# Patient Record
Sex: Male | Born: 1975 | Race: White | Hispanic: No | Marital: Single | Smoking: Former smoker
Health system: Southern US, Community
[De-identification: ages and names within clinical notes are randomized; demographics above are authoritative.]

## PROBLEM LIST (undated history)

## (undated) DIAGNOSIS — H9192 Unspecified hearing loss, left ear: Secondary | ICD-10-CM

## (undated) DIAGNOSIS — Z91199 Patient's noncompliance with other medical treatment and regimen due to unspecified reason: Secondary | ICD-10-CM

## (undated) DIAGNOSIS — M2342 Loose body in knee, left knee: Secondary | ICD-10-CM

## (undated) DIAGNOSIS — E785 Hyperlipidemia, unspecified: Secondary | ICD-10-CM

## (undated) DIAGNOSIS — M1990 Unspecified osteoarthritis, unspecified site: Secondary | ICD-10-CM

## (undated) DIAGNOSIS — F419 Anxiety disorder, unspecified: Secondary | ICD-10-CM

## (undated) DIAGNOSIS — I10 Essential (primary) hypertension: Secondary | ICD-10-CM

## (undated) HISTORY — DX: Hyperlipidemia, unspecified: E78.5

## (undated) HISTORY — PX: ANTERIOR CRUCIATE LIGAMENT REPAIR: SHX115

## (undated) HISTORY — PX: KNEE ARTHROSCOPY: SHX127

## (undated) HISTORY — DX: Essential (primary) hypertension: I10

---

## 2014-11-18 LAB — BASIC METABOLIC PANEL
BUN: 13 mg/dL (ref 4–21)
Potassium: 5.3 mmol/L (ref 3.4–5.3)
SODIUM: 140 mmol/L (ref 137–147)

## 2014-11-18 LAB — HEPATIC FUNCTION PANEL
ALK PHOS: 34 U/L (ref 25–125)
ALT: 39 U/L (ref 10–40)
AST: 22 U/L (ref 14–40)
BILIRUBIN, TOTAL: 0.8 mg/dL

## 2014-11-18 LAB — LIPID PANEL
Cholesterol: 147 mg/dL (ref 0–200)
HDL: 39 mg/dL (ref 35–70)
LDL CALC: 81 mg/dL
TRIGLYCERIDES: 137 mg/dL (ref 40–160)

## 2016-11-08 ENCOUNTER — Ambulatory Visit (INDEPENDENT_AMBULATORY_CARE_PROVIDER_SITE_OTHER): Payer: BLUE CROSS/BLUE SHIELD | Admitting: Family Medicine

## 2016-11-08 ENCOUNTER — Encounter (HOSPITAL_BASED_OUTPATIENT_CLINIC_OR_DEPARTMENT_OTHER): Payer: Self-pay | Admitting: *Deleted

## 2016-11-08 ENCOUNTER — Emergency Department (HOSPITAL_BASED_OUTPATIENT_CLINIC_OR_DEPARTMENT_OTHER): Payer: BLUE CROSS/BLUE SHIELD

## 2016-11-08 ENCOUNTER — Encounter: Payer: Self-pay | Admitting: Family Medicine

## 2016-11-08 VITALS — BP 132/84 | HR 77 | Ht 73.25 in | Wt 238.2 lb

## 2016-11-08 DIAGNOSIS — J3089 Other allergic rhinitis: Secondary | ICD-10-CM | POA: Insufficient documentation

## 2016-11-08 DIAGNOSIS — Z9119 Patient's noncompliance with other medical treatment and regimen: Secondary | ICD-10-CM

## 2016-11-08 DIAGNOSIS — I10 Essential (primary) hypertension: Secondary | ICD-10-CM | POA: Insufficient documentation

## 2016-11-08 DIAGNOSIS — Z91199 Patient's noncompliance with other medical treatment and regimen due to unspecified reason: Secondary | ICD-10-CM | POA: Insufficient documentation

## 2016-11-08 DIAGNOSIS — E785 Hyperlipidemia, unspecified: Secondary | ICD-10-CM

## 2016-11-08 DIAGNOSIS — R0683 Snoring: Secondary | ICD-10-CM | POA: Diagnosis not present

## 2016-11-08 DIAGNOSIS — Z9889 Other specified postprocedural states: Secondary | ICD-10-CM | POA: Insufficient documentation

## 2016-11-08 DIAGNOSIS — Z716 Tobacco abuse counseling: Secondary | ICD-10-CM

## 2016-11-08 DIAGNOSIS — F17200 Nicotine dependence, unspecified, uncomplicated: Secondary | ICD-10-CM | POA: Diagnosis not present

## 2016-11-08 DIAGNOSIS — R0689 Other abnormalities of breathing: Secondary | ICD-10-CM

## 2016-11-08 DIAGNOSIS — R002 Palpitations: Secondary | ICD-10-CM

## 2016-11-08 DIAGNOSIS — E66811 Obesity, class 1: Secondary | ICD-10-CM | POA: Insufficient documentation

## 2016-11-08 DIAGNOSIS — Z8249 Family history of ischemic heart disease and other diseases of the circulatory system: Secondary | ICD-10-CM

## 2016-11-08 DIAGNOSIS — F430 Acute stress reaction: Secondary | ICD-10-CM

## 2016-11-08 DIAGNOSIS — E669 Obesity, unspecified: Secondary | ICD-10-CM

## 2016-11-08 DIAGNOSIS — R0789 Other chest pain: Secondary | ICD-10-CM | POA: Diagnosis present

## 2016-11-08 NOTE — Assessment & Plan Note (Addendum)
FLP and LFT's Tomorrow  Encouraged smoking cess and better bp control as well.   Dietary changes d/c pt and exercise 75min 5 d/wk.  F/up to discuss

## 2016-11-08 NOTE — Assessment & Plan Note (Signed)
Lost to f/up and went off his Chol med and BP med in past year or so.   - Risks associated with noncompliant behavior with my treatment regimen or treatment plan reviewed with patient.   Pt denies depression/ mood disorder, denies significant barriers of education, money etc.  Explained many significant risks associated with these disease processes that if they remain poorly controlled, can even lead to death.

## 2016-11-08 NOTE — Assessment & Plan Note (Signed)
Long d/c pt re: possible sx of GAD vs Cardiac Sx, he was not open at all to the idea that these sx could be stress mediated - we discussed red flag sx- which he has none of- if dev any, call office or to UC if we r not open - Pt tells me during his PE today that he feels his heart racing and palpitations- which exam was N with prolonged auscultation.  - pt's sx have not changed since last seen by his cards doc in [place] and had negative w/up including stress echo, holter monitoring etc.   I told pt he should feel reassurred by this, but he does not. - he declines cards referral now and I rec he let me see his recent past med records first before deciding next steps.

## 2016-11-08 NOTE — Assessment & Plan Note (Addendum)
Wife says he has long periods breath holding followed by gasping for air,  Awakens not feeling refreshed sev mornings - Never had OSA dx or sleep study- says he is finally ready for one esp after explaining possible detriments to health with OSA that is poorly controlled.

## 2016-11-08 NOTE — Patient Instructions (Addendum)
The work-up or your non-specifc heart palpitation symptoms will require a systematic approach.     1- we will obtain fasting blood work on you in the very near future. This will include a CBC, CMP, TSH, A1c, fasting lipid profile, vitamin D and TSH, free t4   2- please give me your med records from your Cards doc in [place].    It is important I see results of the echocardiogram and other cardiac workup that was done by cardiologist prior to determining what next steps need to be done, since these are same symptoms you have had in the past but just more frequent  3- Referral for sleep study placed today  4-  It's important that you make a f/up OV in 2 wks approx to review your labs and review your home BP's.  Bring in your log.  2 wks is good b/c we will have enough data points for your BP.    Marland Kitchenstre

## 2016-11-08 NOTE — Assessment & Plan Note (Signed)
Obtain labs  - will prescribe chantix in future if labs N and pt still desires to quit.  - go to [facility] website and read about smoking cess please.

## 2016-11-08 NOTE — Assessment & Plan Note (Signed)
Lifestyle changes such as dash diet and engaging in a regular exercise program, and losing wt discussed with patient.    Ambulatory BP monitoring encouraged. Keep log and bring in next OV!!  Goal BP <130/80 on regular basis.  If not at goal at home and next ov-- pt understands will need meds

## 2016-11-08 NOTE — ED Triage Notes (Signed)
Pt c/o palpitations  X 2 weeks and left calf pain x 1 day

## 2016-11-08 NOTE — Progress Notes (Signed)
New patient office visit note:  Impression and Recommendations:    1. Obesity, Class I, BMI 30-34.9   2. Hyperlipidemia, unspecified hyperlipidemia type   3. Essential hypertension   4. Snores   5. Breath holding episodes   6. Acute reaction to stress   7. medical Noncompliance   8. Tobacco use disorder   9. Tobacco abuse counseling   10. Palpitations   11. Family history of early CAD     Orders Placed This Encounter  Procedures  . CBC with Differential/Platelet  . Comprehensive metabolic panel  . Hemoglobin A1c  . Lipid panel  . T4, free  . TSH  . VITAMIN D 25 Hydroxy (Vit-D Deficiency, Fractures)  . Ambulatory referral to Sleep Studies     Snores  Wife says he has long periods breath holding followed by gasping for air,  Awakens not feeling refreshed sev mornings - Never had OSA dx or sleep study- says he is finally ready for one esp after explaining possible detriments to health with OSA that is poorly controlled.   Acute reaction to stress Long d/c pt re: possible sx of GAD vs Cardiac Sx, he was not open at all to the idea that these sx could be stress mediated - we discussed red flag sx- which he has none of- if dev any, call office or to UC if we r not open - Pt tells me during his PE today that he feels his heart racing and palpitations- which exam was N with prolonged auscultation.  - pt's sx have not changed since last seen by his cards doc in [place] and had negative w/up including stress echo, holter monitoring etc.   I told pt he should feel reassurred by this, but he does not. - he declines cards referral now and I rec he let me see his recent past med records first before deciding next steps.   medical Noncompliance Lost to f/up and went off his Chol med and BP med in past year or so.   - Risks associated with noncompliant behavior with my treatment regimen or treatment plan reviewed with patient.   Pt denies depression/ mood disorder, denies significant  barriers of education, money etc.  Explained many significant risks associated with these disease processes that if they remain poorly controlled, can even lead to death.  Hypertension Lifestyle changes such as dash diet and engaging in a regular exercise program, and losing wt discussed with patient.    Ambulatory BP monitoring encouraged. Keep log and bring in next OV!!  Goal BP <130/80 on regular basis.  If not at goal at home and next ov-- pt understands will need meds  Tobacco use disorder Obtain labs  - will prescribe chantix in future if labs N and pt still desires to quit.  - go to [facility] website and read about smoking cess please.   Hyperlipidemia FLP and LFT's Tomorrow  Encouraged smoking cess and better bp control as well.   Dietary changes d/c pt and exercise 23min 5 d/wk.  F/up to discuss   Palpitations Obtain cmp, TSH, T4, cbc, etc  Explained that could be electrolyte ABN or thyroid issue or stress etc.  - await labs and med records.  - f/up 1-2 wks.   Pt was in the office today for 50+ minutes, with over 50% time spent in face to face counseling of various medical concerns and in coordination of care  Patient's Medications  New Prescriptions   No medications  on file  Previous Medications   ATORVASTATIN (LIPITOR) 40 MG TABLET    Take 40 mg by mouth daily.   LOSARTAN-HYDROCHLOROTHIAZIDE (HYZAAR) 50-12.5 MG TABLET    Take 1 tablet by mouth daily.  Modified Medications   No medications on file  Discontinued Medications   No medications on file    The patient was counseled, risk factors were discussed, anticipatory guidance given.  Gross side effects, risk and benefits, and alternatives of medications discussed with patient.  Patient is aware that all medications have potential side effects and we are unable to predict every side effect or drug-drug interaction that may occur.  Expresses verbal understanding and consents to current therapy plan and  treatment regimen.   Return in about 1 week (around 11/15/2016) for Fasting blood work and then ov with [place] 2 wks- reck bp, smok cess, chol.   Please see AVS handed out to patient at the end of our visit for further patient instructions/ counseling done pertaining to today's office visit.    Note: This document was prepared using Dragon voice recognition software and may include unintentional dictation errors.  ----------------------------------------------------------------------------------------------------------------------    Subjective:    Chief complaint:   Chief Complaint  Patient presents with  . Establish Care  . Palpitations     HPI: Anthony Garcia is a pleasant 41 y.o. male who presents to [place] [facility] today to review their medical history with me and establish care.   I asked the patient to review their chronic problem list with me to ensure everything was updated and accurate.    All recent office visits with other providers, any medical records that patient brought in etc  - I reviewed today.     Also asked pt to get me medical records from [facility] providers/ specialists that they had seen within the past 3-5 years- if they are in private practice and/or do not work for a [employer], [facility], [place], [place] or [employer] owned practice.  Told them to call their specialists to clarify this if they are not sure.    - never really had a Doc in the past.  Recently moved here [place]-  Was in [place] for over a year and now here for over a year.  First establish PCP is [place].  Moved for work.    Married- 3 kids.  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     ---> Has gone to a Cards doc about one year ago for w/up of his sx. .   Pt seen him in [place].  Did EKG and exercise stress echo; placed on BP meds and chol meds.   Pt had heart palpitations and that's why he saw him - w/up was Negative.  He is now having same sx but now worse.   Onset 5-6 yrs ago- only  started after his dad died--> started worrying about having heart problem which is what he passed of. .     Now having heart palp daily- started 2 wks ago- only occ--> then couple days ago--> worse at night- while lying down watching tv.   This is how they used to be in the past-  Most intense while lying down at night/ when not doing anything- seem most prominent.   Pt denies being stressed.   No sx while exercising- which he swims once wkly or more.  When thinks about it-- can cause SOB/ DIB o/w- no problems breathing.    Fam HX:  Dad died 2007/01/29 age 69- AMI and died. Dad had Chol issues.   Estranged from Mom---unknw med hx.  Siblings- brother- chol issues- 3 yrs older.  Grandparent 1 heavy smoking- died lung ca in late 29-Jan-2023.    Started smoking again--> 1/2 ppd- 2yrs.  Used chantix in past - worked well and he got off cig - for 2 yrs - recently.    Wants to quit.   No s-e to chantix in past- he loved the dreams.   Medically -compliance:  Has been off his chol and BP meds for over a year now-->    Tried some expired ones recently when he started worrying about prominent heart beat.     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 Wife says he stopps breathing at times and then gasps for air.    Wt Readings from Last 3 Encounters:  11/08/16 230 lb (104.3 kg)  11/08/16 238 lb 3.2 oz (108 kg)   BP Readings from Last 3 Encounters:  11/08/16 142/93  11/08/16 132/84   Pulse Readings from Last 3 Encounters:  11/08/16 82  11/08/16 77   BMI Readings from Last 3 Encounters:  11/08/16 29.53 kg/m  11/08/16 31.21 kg/m    Patient Care Team    Relationship Specialty Notifications Start End  No Pcp Per Patient PCP - General General Practice  11/08/16 11/08/16    Patient Active Problem List   Diagnosis Date Noted  . Hyperlipidemia 11/08/2016    Priority: High  . Hypertension 11/08/2016    Priority: High  . Acute reaction to stress 11/08/2016    Priority: High  . medical  Noncompliance 11/08/2016    Priority: High  . Snores 11/08/2016    Priority: Medium  . Family history of early CAD 11/08/2016    Priority: Low  . H/O knee surgery- multiple L knee sx 11/08/2016  . Breath holding episodes 11/08/2016  . Tobacco use disorder 11/08/2016  . Tobacco abuse counseling 11/08/2016  . Palpitations 11/08/2016  . Obesity, Class I, BMI 30-34.9 11/08/2016  . Environmental and seasonal allergies 11/08/2016     Past Medical History:  Diagnosis Date  . Hyperlipidemia   . Hypertension      Past Medical History:  Diagnosis Date  . Hyperlipidemia   . Hypertension      Past Surgical History:  Procedure Laterality Date  . ANTERIOR CRUCIATE LIGAMENT REPAIR Left   . KNEE ARTHROSCOPY Left      Family History  Problem Relation Age of Onset  . Heart attack Father   . Hyperlipidemia Father   . Hypertension Father   . Hyperlipidemia Brother   . Hypertension Brother   . Healthy Son   . Healthy Son   . Healthy Son      History  Drug Use No     History  Alcohol Use No     History  Smoking Status  . Current Every Day Smoker  . Packs/day: 0.50  . Years: 15.00  Smokeless Tobacco  . Never Used     Outpatient Encounter Prescriptions as of 11/08/2016  Medication Sig  . atorvastatin (LIPITOR) 40 MG tablet Take 40 mg by mouth daily.  Marland Kitchen losartan-hydrochlorothiazide (HYZAAR) 50-12.5 MG tablet Take 1 tablet by mouth daily.   No facility-administered encounter medications on file as of 11/08/2016.     Allergies: Patient has no known allergies.   Review of Systems  Constitutional: Positive for malaise/fatigue. Negative for chills, diaphoresis, fever and weight loss.  Fatigue  HENT: Negative for congestion, sore throat and tinnitus.   Eyes: Negative for blurred vision, double vision and photophobia.  Respiratory: Positive for cough. Negative for wheezing.        Has had dry cough for about a month- and URI allergy symptoms.    Cardiovascular: Positive for chest pain and palpitations. Negative for leg swelling.       Feels a pounding in her chest.  Gastrointestinal: Negative for blood in stool, diarrhea, nausea and vomiting.  Genitourinary: Negative for dysuria, frequency and urgency.  Musculoskeletal: Negative for joint pain and myalgias.  Skin: Negative for itching and rash.  Neurological: Positive for weakness. Negative for dizziness, focal weakness and headaches.  Endo/Heme/Allergies: Negative for environmental allergies and polydipsia. Does not bruise/bleed easily.  Psychiatric/Behavioral: Negative for depression and memory loss. The patient is nervous/anxious. The patient does not have insomnia.      Objective:   Blood pressure 132/84, pulse 77, height 6' 1.25" (1.861 m), weight 238 lb 3.2 oz (108 kg). Body mass index is 31.21 kg/m. General: Well Developed, well nourished, and in no acute distress.  Neuro: Alert and oriented x3, extra-ocular muscles intact, sensation grossly intact.  HEENT:[place]/AT, PERRLA, neck supple, No carotid bruits,   Oropharynx clear.  TMs-within normal limits bilaterally, no sinus tenderness to palpation, nares- patent without purulent discharge Skin: no gross rashes  Cardiac: Regular rate and rhythm,  No murmur gallops or rubs.  Respiratory: Essentially clear to auscultation bilaterally,  And anterior posteriorly-no crackles wheezes or rales.. Not using accessory muscles, speaking in full sentences.  Abdominal: not grossly distended Musculoskeletal: Ambulates w/o diff, FROM * 4 ext.  Vasc: less 2 sec cap RF, warm and pink  Psych:  No HI/SI, judgement and insight good, hyperactive, anxious mood. agitated Affect.   No results found for this or any previous visit (from the past 2160 hour(s)).

## 2016-11-08 NOTE — Assessment & Plan Note (Signed)
Obtain cmp, TSH, T4, cbc, etc  Explained that could be electrolyte ABN or thyroid issue or stress etc.  - await labs and med records.  - f/up 1-2 wks.

## 2016-11-09 ENCOUNTER — Emergency Department (HOSPITAL_BASED_OUTPATIENT_CLINIC_OR_DEPARTMENT_OTHER): Payer: BLUE CROSS/BLUE SHIELD

## 2016-11-09 ENCOUNTER — Emergency Department (HOSPITAL_BASED_OUTPATIENT_CLINIC_OR_DEPARTMENT_OTHER)
Admission: EM | Admit: 2016-11-09 | Discharge: 2016-11-09 | Disposition: A | Discharge status: Home / Self Care | Payer: BLUE CROSS/BLUE SHIELD | Attending: Emergency Medicine | Admitting: Emergency Medicine

## 2016-11-09 DIAGNOSIS — R079 Chest pain, unspecified: Secondary | ICD-10-CM

## 2016-11-09 DIAGNOSIS — R002 Palpitations: Secondary | ICD-10-CM

## 2016-11-09 LAB — CBC WITH DIFFERENTIAL/PLATELET
BASOS PCT: 0 %
Basophils Absolute: 0 10*3/uL (ref 0.0–0.1)
EOS ABS: 0.2 10*3/uL (ref 0.0–0.7)
Eosinophils Relative: 2 %
HCT: 46.3 % (ref 39.0–52.0)
HEMOGLOBIN: 16 g/dL (ref 13.0–17.0)
LYMPHS ABS: 3.8 10*3/uL (ref 0.7–4.0)
Lymphocytes Relative: 32 %
MCH: 29.6 pg (ref 26.0–34.0)
MCHC: 34.6 g/dL (ref 30.0–36.0)
MCV: 85.6 fL (ref 78.0–100.0)
Monocytes Absolute: 0.9 10*3/uL (ref 0.1–1.0)
Monocytes Relative: 7 %
NEUTROS PCT: 59 %
Neutro Abs: 7.1 10*3/uL (ref 1.7–7.7)
Platelets: 238 10*3/uL (ref 150–400)
RBC: 5.41 MIL/uL (ref 4.22–5.81)
RDW: 12.8 % (ref 11.5–15.5)
WBC: 12 10*3/uL — AB (ref 4.0–10.5)

## 2016-11-09 LAB — BASIC METABOLIC PANEL
ANION GAP: 9 (ref 5–15)
BUN: 23 mg/dL — ABNORMAL HIGH (ref 6–20)
CHLORIDE: 100 mmol/L — AB (ref 101–111)
CO2: 24 mmol/L (ref 22–32)
CREATININE: 1.03 mg/dL (ref 0.61–1.24)
Calcium: 8.8 mg/dL — ABNORMAL LOW (ref 8.9–10.3)
GFR calc non Af Amer: >60 mL/min (ref >60)
Glucose, Bld: 104 mg/dL — ABNORMAL HIGH (ref 65–99)
Potassium: 3.3 mmol/L — ABNORMAL LOW (ref 3.5–5.1)
SODIUM: 133 mmol/L — AB (ref 135–145)

## 2016-11-09 LAB — MAGNESIUM: Magnesium: 2.1 mg/dL (ref 1.7–2.4)

## 2016-11-09 LAB — TROPONIN I: Troponin I: <0.03 ng/mL (ref <0.03)

## 2016-11-09 LAB — D-DIMER, QUANTITATIVE: D-Dimer, Quant: <0.27 ug/mL-FEU (ref 0.00–0.50)

## 2016-11-09 LAB — TSH: TSH: 1.05 u[IU]/mL (ref 0.350–4.500)

## 2016-11-09 MED ORDER — SULFAMETHOXAZOLE-TRIMETHOPRIM 800-160 MG PO TABS: 1.0000 | ORAL_TABLET | Freq: Once | ORAL | Status: DC

## 2016-11-09 MED ORDER — POTASSIUM CHLORIDE CRYS ER 20 MEQ PO TBCR
40.0000 meq | EXTENDED_RELEASE_TABLET | Freq: Once | ORAL | Status: AC
  Administered 2016-11-09: 40 meq via ORAL
  Filled 2016-11-09: qty 2

## 2016-11-09 NOTE — ED Notes (Signed)
Pt states his leg pain has resolved and he denies any chest pain.

## 2016-11-09 NOTE — ED Provider Notes (Signed)
By signing my name below, I, Anthony Garcia, attest that this documentation has been prepared under the direction and in the presence of Gibson, DO. Electronically signed, Anthony Garcia, ED Scribe. 11/09/16. 2:08 AM.  TIME SEEN: 2:01 AM  CHIEF COMPLAINT: Left leg/calf pain, chest discomfort.  HPI:  HPI Comments: Anthony Garcia is a 41 y.o. male who presents to the Emergency Department complaining of left lower extremity pain and swelling that started yesterday and associated chest discomfort and heart palpitations that started approximately two to three weeks ago. Pt reports intermittent heart palpitations for the past two weeks and a "tenderness" to left lateral chest that is specifically noted when laying on his side. He states that he noticed his left lower extremity felt tight approximately 24 hours ago but then noticed swelling throughout the day today. Pt states that the swelling has improved today. Pt reports that it looked like veins were bulging out of his leg this morning. He also notes associated lightheadedness intermittently since the onset of his symptoms. Symptoms worse with standing. Pt has not experienced the heart palpitations today. No chest pain today. He has not had any shortness of breath. Denies nausea, vomiting or diarrhea. No diaphoresis. No family history of premature CAD. He denies history of PE or DVT. No injury to the left leg.  ROS: See HPI Constitutional: no fever  Eyes: no drainage  ENT: no runny nose   Cardiovascular:  + chest pain  Resp: no SOB  GI: no vomiting GU: no dysuria Integumentary: no rash  Allergy: no hives  Musculoskeletal: + left leg swelling  Neurological: no slurred speech ROS otherwise negative  PAST MEDICAL HISTORY/PAST SURGICAL HISTORY:  Past Medical History:  Diagnosis Date  . Hyperlipidemia   . Hypertension     MEDICATIONS:  Prior to Admission medications   Medication Sig Start Date End Date Taking? Authorizing Provider   atorvastatin (LIPITOR) 40 MG tablet Take 40 mg by mouth daily.    Historical Provider, MD  losartan-hydrochlorothiazide (HYZAAR) 50-12.5 MG tablet Take 1 tablet by mouth daily.    Historical Provider, MD    ALLERGIES:  No Known Allergies  SOCIAL HISTORY:  Social History  Substance Use Topics  . Smoking status: Current Every Day Smoker    Packs/day: 0.50    Years: 15.00  . Smokeless tobacco: Never Used  . Alcohol use No    FAMILY HISTORY: Family History  Problem Relation Age of Onset  . Heart attack Father   . Hyperlipidemia Father   . Hypertension Father   . Hyperlipidemia Brother   . Hypertension Brother   . Healthy Son   . Healthy Son   . Healthy Son     EXAM: BP 129/77 (BP Location: Right Arm)   Pulse 80   Temp 98.2 F (36.8 C) (Oral)   Resp 16   Ht 6\' 2"  (1.88 m)   Wt 230 lb (104.3 kg)   SpO2 96%   BMI 29.53 kg/m  CONSTITUTIONAL: Alert and oriented and responds appropriately to questions. Well-appearing; well-nourished HEAD: Normocephalic EYES: Conjunctivae clear, PERRL, EOMI ENT: normal nose; no rhinorrhea; moist mucous membranes NECK: Supple, no meningismus, no nuchal rigidity, no LAD  CARD: RRR; S1 and S2 appreciated; no murmurs, no clicks, no rubs, no gallops CHEST: Mildly TTP over left lateral chest wall without crepitus or deformity. RESP: Normal chest excursion without splinting or tachypnea; breath sounds clear and equal bilaterally; no wheezes, no rhonchi, no rales, no hypoxia or respiratory distress, speaking full  sentences ABD/GI: Normal bowel sounds; non-distended; soft, non-tender, no rebound, no guarding, no peritoneal signs, no hepatosplenomegaly BACK:  The back appears normal and is non-tender to palpation, there is no CVA tenderness EXT: Normal ROM in all joints; non-tender to palpation; no edema; normal capillary refill; no cyanosis, no calf tenderness or swelling, No obvious bony deformity or swelling noted to the left lower extremity  compared to the right, no erythema or warmth, no joint effusion, compartments are soft    SKIN: Normal color for age and race; warm; no rash NEURO: Moves all extremities equally, sensation to light touch intact diffusely, cranial nerves II through XII intact, normal speech PSYCH: The patient's mood and manner are appropriate. Grooming and personal hygiene are appropriate.  MEDICAL DECISION MAKING: Patient here with complaints of chest pain, shortness of breath, palpitations and dizziness that are intermittent and now gone. Also complaining of left flank pain without injury. EKG shows no ischemic abnormality, arrhythmia or interval abnormality. Venous Doppler obtained in triage is negative for thrombophlebitis or DVT. Labs show no significant anemia. Potassium on the low side of normal at 3.3. We will give oral replacement. Recommended that we check TSH, magnesium and d-dimer to rule out pulmonary embolus and other electrolyte abnormalities. Patient is a crisis plan. Low suspicion for ACS.  ED PROGRESS: Patient's chest x-ray is clear. Magnesium normal. D-dimer negative. TSH pending and have recommended that a PCP for can follow up with this. Denies increased caffeine intake, illicit drug use. Recommend increased water intake at home. Recommended close cardiology follow-up for continuous palpitations and chest pain. Not having any palpitations now or chest pain currently. I feel he is safe to be discharged home. I have him with a list of primary care physicians as well.  At this time, I do not feel there is any life-threatening condition present. I have reviewed and discussed all results (EKG, imaging, lab, urine as appropriate) and exam findings with patient/family. I have reviewed nursing notes and appropriate previous records.  I feel the patient is safe to be discharged home without further emergent workup and can continue workup as an outpatient as needed. Discussed usual and customary return  precautions. Patient/family verbalize understanding and are comfortable with this plan.  Outpatient follow-up has been provided. All questions have been answered.    EKG Interpretation  Date/Time:  Monday November 08 2016 22:32:56 EST Ventricular Rate:  87 PR Interval:  146 QRS Duration: 86 QT Interval:  354 QTC Calculation: 425 R Axis:   20 Text Interpretation:  Normal sinus rhythm Possible Left atrial enlargement Cannot rule out Anterior infarct , age undetermined Abnormal ECG No old tracing to compare Confirmed by Temiloluwa Laredo,  DO, Mareli Antunes ([id number]) on 11/09/2016 2:01:31 AM        I personally performed the services described in this documentation, which was scribed in my presence. The recorded information has been reviewed and is accurate.     [place], DO 11/09/16 [id number]

## 2016-11-09 NOTE — ED Notes (Signed)
Pt verbalizes understanding of d/c instructions and denies any further needs at this time. 

## 2016-11-11 ENCOUNTER — Other Ambulatory Visit (INDEPENDENT_AMBULATORY_CARE_PROVIDER_SITE_OTHER): Payer: BLUE CROSS/BLUE SHIELD

## 2016-11-11 DIAGNOSIS — I10 Essential (primary) hypertension: Secondary | ICD-10-CM | POA: Diagnosis not present

## 2016-11-11 DIAGNOSIS — E669 Obesity, unspecified: Secondary | ICD-10-CM

## 2016-11-11 DIAGNOSIS — Z8249 Family history of ischemic heart disease and other diseases of the circulatory system: Secondary | ICD-10-CM

## 2016-11-11 DIAGNOSIS — E785 Hyperlipidemia, unspecified: Secondary | ICD-10-CM

## 2016-11-11 DIAGNOSIS — F17200 Nicotine dependence, unspecified, uncomplicated: Secondary | ICD-10-CM

## 2016-11-12 LAB — CBC WITH DIFFERENTIAL/PLATELET
BASOS ABS: 0.1 10*3/uL (ref 0.0–0.2)
BASOS: 1 %
EOS (ABSOLUTE): 0.2 10*3/uL (ref 0.0–0.4)
EOS: 3 %
HEMATOCRIT: 46.2 % (ref 37.5–51.0)
HEMOGLOBIN: 16 g/dL (ref 13.0–17.7)
IMMATURE GRANS (ABS): 0 10*3/uL (ref 0.0–0.1)
Immature Granulocytes: 0 %
LYMPHS: 32 %
Lymphocytes Absolute: 2.4 10*3/uL (ref 0.7–3.1)
MCH: 30.1 pg (ref 26.6–33.0)
MCHC: 34.6 g/dL (ref 31.5–35.7)
MCV: 87 fL (ref 79–97)
MONOCYTES: 7 %
Monocytes Absolute: 0.5 10*3/uL (ref 0.1–0.9)
NEUTROS ABS: 4.3 10*3/uL (ref 1.4–7.0)
Neutrophils: 57 %
Platelets: 237 10*3/uL (ref 150–379)
RBC: 5.31 x10E6/uL (ref 4.14–5.80)
RDW: 13.6 % (ref 12.3–15.4)
WBC: 7.5 10*3/uL (ref 3.4–10.8)

## 2016-11-12 LAB — HEMOGLOBIN A1C
Est. average glucose Bld gHb Est-mCnc: 123 mg/dL
Hgb A1c MFr Bld: 5.9 % — ABNORMAL HIGH (ref 4.8–5.6)

## 2016-11-12 LAB — COMPREHENSIVE METABOLIC PANEL
ALBUMIN: 4.3 g/dL (ref 3.5–5.5)
ALT: 24 IU/L (ref 0–44)
AST: 17 IU/L (ref 0–40)
Albumin/Globulin Ratio: 1.6 (ref 1.2–2.2)
Alkaline Phosphatase: 29 IU/L — ABNORMAL LOW (ref 39–117)
BILIRUBIN TOTAL: 1.2 mg/dL (ref 0.0–1.2)
BUN / CREAT RATIO: 15 (ref 9–20)
BUN: 17 mg/dL (ref 6–20)
CHLORIDE: 104 mmol/L (ref 96–106)
CO2: 25 mmol/L (ref 18–29)
CREATININE: 1.13 mg/dL (ref 0.76–1.27)
Calcium: 9.2 mg/dL (ref 8.7–10.2)
GFR calc non Af Amer: 81 mL/min/{1.73_m2} (ref >59)
GFR, EST AFRICAN AMERICAN: 94 mL/min/{1.73_m2} (ref >59)
GLUCOSE: 96 mg/dL (ref 65–99)
Globulin, Total: 2.7 g/dL (ref 1.5–4.5)
Potassium: 4.3 mmol/L (ref 3.5–5.2)
Sodium: 142 mmol/L (ref 134–144)
TOTAL PROTEIN: 7 g/dL (ref 6.0–8.5)

## 2016-11-12 LAB — TSH: TSH: 0.606 u[IU]/mL (ref 0.450–4.500)

## 2016-11-12 LAB — T4, FREE: Free T4: 1.56 ng/dL (ref 0.82–1.77)

## 2016-11-12 LAB — VITAMIN D 25 HYDROXY (VIT D DEFICIENCY, FRACTURES): VIT D 25 HYDROXY: 24.6 ng/mL — AB (ref 30.0–100.0)

## 2016-11-16 ENCOUNTER — Other Ambulatory Visit: Payer: Self-pay

## 2016-11-16 MED ORDER — VITAMIN D (ERGOCALCIFEROL) 1.25 MG (50000 UNIT) PO CAPS: 50000.0000 [IU] | ORAL_CAPSULE | ORAL | 2 refills | Status: DC

## 2016-11-16 NOTE — Progress Notes (Signed)
Per Lillard Anes, RX for ergocalciferol sent to pharmacy.  Pt informed.  Charyl Bigger, CMA

## 2016-11-18 ENCOUNTER — Ambulatory Visit (INDEPENDENT_AMBULATORY_CARE_PROVIDER_SITE_OTHER): Payer: BLUE CROSS/BLUE SHIELD | Admitting: Adult Health

## 2016-11-18 ENCOUNTER — Encounter: Payer: Self-pay | Admitting: Adult Health

## 2016-11-18 VITALS — BP 143/84 | HR 89 | Ht 73.25 in | Wt 238.3 lb

## 2016-11-18 DIAGNOSIS — R002 Palpitations: Secondary | ICD-10-CM | POA: Diagnosis not present

## 2016-11-18 DIAGNOSIS — E785 Hyperlipidemia, unspecified: Secondary | ICD-10-CM | POA: Diagnosis not present

## 2016-11-18 DIAGNOSIS — I10 Essential (primary) hypertension: Secondary | ICD-10-CM

## 2016-11-18 DIAGNOSIS — Z716 Tobacco abuse counseling: Secondary | ICD-10-CM

## 2016-11-18 MED ORDER — SERTRALINE HCL 50 MG PO TABS: 50.0000 mg | ORAL_TABLET | Freq: Every day | ORAL | 0 refills | Status: DC

## 2016-11-18 MED ORDER — VARENICLINE TARTRATE 0.5 MG X 11 & 1 MG X 42 PO MISC: ORAL | 0 refills | Status: DC

## 2016-11-18 NOTE — Assessment & Plan Note (Signed)
Take Chantix as directed.  If any thoughts of harming self/others, immediately stop taking and come into clinic.

## 2016-11-18 NOTE — Patient Instructions (Addendum)
Palpitations Introduction A palpitation is the feeling that your heart:  Has an uneven (irregular) heartbeat.  Is beating faster than normal.  Is fluttering.  Is skipping a beat. This is usually not a serious problem. In some cases, you may need more medical tests. Follow these instructions at home:  Avoid:  Caffeine in coffee, tea, soft drinks, diet pills, and energy drinks.  Chocolate.  Alcohol.  Do not use any tobacco products. These include cigarettes, chewing tobacco, and e-cigarettes. If you need help quitting, ask your doctor.  Try to reduce your stress. These things may help:  Yoga.  Meditation.  Physical activity. Swimming, jogging, and walking are good choices.  A method that helps you use your mind to control things in your body, like heartbeats (biofeedback).  Get plenty of rest and sleep.  Take over-the-counter and prescription medicines only as told by your doctor.  Keep all follow-up visits as told by your doctor. This is important. Contact a doctor if:  Your heartbeat is still fast or uneven after 24 hours.  Your palpitations occur more often. Get help right away if:  You have chest pain.  You feel short of breath.  You have a very bad headache.  You feel dizzy.  You pass out (faint). This information is not intended to replace advice given to you by your health care provider. Make sure you discuss any questions you have with your health care provider. Document Released: 07/13/2008 Document Revised: 03/11/2016 Document Reviewed: 06/19/2015  2017 Elsevier Varenicline oral tablets What is this medicine? VARENICLINE (var EN i kleen) is used to help people quit smoking. It can reduce the symptoms caused by stopping smoking. It is used with a patient support program recommended by your physician. This medicine may be used for other purposes; ask your health care provider or pharmacist if you have questions. COMMON BRAND NAME(S): Chantix What  should I tell my health care provider before I take this medicine? They need to know if you have any of these conditions: -bipolar disorder, depression, schizophrenia or other mental illness -heart disease -if you often drink alcohol -kidney disease -peripheral vascular disease -seizures -stroke -suicidal thoughts, plans, or attempt; a previous suicide attempt by you or a family member -an unusual or allergic reaction to varenicline, other medicines, foods, dyes, or preservatives -pregnant or trying to get pregnant -breast-feeding How should I use this medicine? Take this medicine by mouth after eating. Take with a full glass of water. Follow the directions on the prescription label. Take your doses at regular intervals. Do not take your medicine more often than directed. There are 3 ways you can use this medicine to help you quit smoking; talk to your health care professional to decide which plan is right for you: 1) you can choose a quit date and start this medicine 1 week before the quit date, or, 2) you can start taking this medicine before you choose a quit date, and then pick a quit date between day 8 and 35 days of treatment, or, 3) if you are not sure that you are able or willing to quit smoking right away, start taking this medicine and slowly decrease the amount you smoke as directed by your health care professional with the goal of being cigarette-free by week 12 of treatment. Stick to your plan; ask about support groups or other ways to help you remain cigarette-free. If you are motivated to quit smoking and did not succeed during a previous attempt with this medicine  for reasons other than side effects, or if you returned to smoking after this treatment, speak with your health care professional about whether another course of this medicine may be right for you. A special MedGuide will be given to you by the pharmacist with each prescription and refill. Be sure to read this  information carefully each time. Talk to your pediatrician regarding the use of this medicine in children. This medicine is not approved for use in children. Overdosage: If you think you have taken too much of this medicine contact a poison control center or emergency room at once. NOTE: This medicine is only for you. Do not share this medicine with others. What if I miss a dose? If you miss a dose, take it as soon as you can. If it is almost time for your next dose, take only that dose. Do not take double or extra doses. What may interact with this medicine? -alcohol or any product that contains alcohol -insulin -other stop smoking aids -theophylline -warfarin This list may not describe all possible interactions. Give your health care provider a list of all the medicines, herbs, non-prescription drugs, or dietary supplements you use. Also tell them if you smoke, drink alcohol, or use illegal drugs. Some items may interact with your medicine. What should I watch for while using this medicine? Visit your doctor or health care professional for regular check ups. Ask for ongoing advice and encouragement from your doctor or healthcare professional, friends, and family to help you quit. If you smoke while on this medication, quit again Your mouth may get dry. Chewing sugarless gum or sucking hard candy, and drinking plenty of water may help. Contact your doctor if the problem does not go away or is severe. You may get drowsy or dizzy. Do not drive, use machinery, or do anything that needs mental alertness until you know how this medicine affects you. Do not stand or sit up quickly, especially if you are an older patient. This reduces the risk of dizzy or fainting spells. Sleepwalking can happen during treatment with this medicine, and can sometimes lead to behavior that is harmful to you, other people, or property. Stop taking this medicine and tell your doctor if you start sleepwalking or have other  unusual sleep-related activity. Decrease the amount of alcoholic beverages that you drink during treatment with this medicine until you know if this medicine affects your ability to tolerate alcohol. Some people have experienced increased drunkenness (intoxication), unusual or sometimes aggressive behavior, or no memory of things that have happened (amnesia) during treatment with this medicine. The use of this medicine may increase the chance of suicidal thoughts or actions. Pay special attention to how you are responding while on this medicine. Any worsening of mood, or thoughts of suicide or dying should be reported to your health care professional right away. What side effects may I notice from receiving this medicine? Side effects that you should report to your doctor or health care professional as soon as possible: -allergic reactions like skin rash, itching or hives, swelling of the face, lips, tongue, or throat -acting aggressive, being angry or violent, or acting on dangerous impulses -breathing problems -changes in vision -chest pain or chest tightness -confusion, trouble speaking or understanding -new or worsening depression, anxiety, or panic attacks -extreme increase in activity and talking (mania) -fast, irregular heartbeat -feeling faint or lightheaded, falls -fever -pain in legs when walking -problems with balance, talking, walking -redness, blistering, peeling or loosening of the skin,  including inside the mouth -ringing in ears -seeing or hearing things that aren't there (hallucinations) -seizures -sleepwalking -sudden numbness or weakness of the face, arm or leg -thoughts about suicide or dying, or attempts to commit suicide -trouble passing urine or change in the amount of urine -unusual bleeding or bruising -unusually weak or tired Side effects that usually do not require medical attention (report to your doctor or health care professional if they continue or are  bothersome): -constipation -headache -nausea, vomiting -strange dreams -stomach gas -trouble sleeping This list may not describe all possible side effects. Call your doctor for medical advice about side effects. You may report side effects to FDA at 1-800-FDA-1088. Where should I keep my medicine? Keep out of the reach of children. Store at room temperature between 15 and 30 degrees C (59 and 86 degrees F). Throw away any unused medicine after the expiration date. NOTE: This sheet is a summary. It may not cover all possible information. If you have questions about this medicine, talk to your doctor, pharmacist, or health care provider.  2017 Elsevier/Gold Standard (2015-06-19 16:14:23)  Sertraline tablets What is this medicine? SERTRALINE (SER tra leen) is used to treat depression. It may also be used to treat obsessive compulsive disorder, panic disorder, post-trauma stress, premenstrual dysphoric disorder (PMDD) or social anxiety. This medicine may be used for other purposes; ask your health care provider or pharmacist if you have questions. COMMON BRAND NAME(S): Zoloft What should I tell my health care provider before I take this medicine? They need to know if you have any of these conditions: -bleeding disorders -bipolar disorder or a family history of bipolar disorder -glaucoma -heart disease -high blood pressure -history of irregular heartbeat -history of low levels of calcium, magnesium, or potassium in the blood -if you often drink alcohol -liver disease -receiving electroconvulsive therapy -seizures -suicidal thoughts, plans, or attempt; a previous suicide attempt by you or a family member -take medicines that treat or prevent blood clots -thyroid disease -an unusual or allergic reaction to sertraline, other medicines, foods, dyes, or preservatives -pregnant or trying to get pregnant -breast-feeding How should I use this medicine? Take this medicine by mouth with a  glass of water. Follow the directions on the prescription label. You can take it with or without food. Take your medicine at regular intervals. Do not take your medicine more often than directed. Do not stop taking this medicine suddenly except upon the advice of your doctor. Stopping this medicine too quickly may cause serious side effects or your condition may worsen. A special MedGuide will be given to you by the pharmacist with each prescription and refill. Be sure to read this information carefully each time. Talk to your pediatrician regarding the use of this medicine in children. While this drug may be prescribed for children as young as 7 years for selected conditions, precautions do apply. Overdosage: If you think you have taken too much of this medicine contact a poison control center or emergency room at once. NOTE: This medicine is only for you. Do not share this medicine with others. What if I miss a dose? If you miss a dose, take it as soon as you can. If it is almost time for your next dose, take only that dose. Do not take double or extra doses. What may interact with this medicine? Do not take this medicine with any of the following medications: -certain medicines for fungal infections like fluconazole, itraconazole, ketoconazole, posaconazole, voriconazole -cisapride -disulfiram -dofetilide -linezolid -MAOIs  like Carbex, Eldepryl, Marplan, Nardil, and Parnate -metronidazole -methylene blue (injected into a vein) -pimozide -thioridazine -ziprasidone This medicine may also interact with the following medications: -alcohol -amphetamines -aspirin and aspirin-like medicines -certain medicines for depression, anxiety, or psychotic disturbances -certain medicines for irregular heart beat like flecainide, propafenone -certain medicines for migraine headaches like almotriptan, eletriptan, frovatriptan, naratriptan, rizatriptan, sumatriptan, zolmitriptan -certain medicines for  sleep -certain medicines for seizures like carbamazepine, valproic acid, phenytoin -certain medicines that treat or prevent blood clots like warfarin, enoxaparin, dalteparin -cimetidine -digoxin -diuretics -fentanyl -furazolidone -isoniazid -lithium -NSAIDs, medicines for pain and inflammation, like ibuprofen or naproxen -other medicines that prolong the QT interval (cause an abnormal heart rhythm) -procarbazine -rasagiline -supplements like St. John's wort, kava kava, valerian -tolbutamide -tramadol -tryptophan This list may not describe all possible interactions. Give your health care provider a list of all the medicines, herbs, non-prescription drugs, or dietary supplements you use. Also tell them if you smoke, drink alcohol, or use illegal drugs. Some items may interact with your medicine. What should I watch for while using this medicine? Tell your doctor if your symptoms do not get better or if they get worse. Visit your doctor or health care professional for regular checks on your progress. Because it may take several weeks to see the full effects of this medicine, it is important to continue your treatment as prescribed by your doctor. Patients and their families should watch out for new or worsening thoughts of suicide or depression. Also watch out for sudden changes in feelings such as feeling anxious, agitated, panicky, irritable, hostile, aggressive, impulsive, severely restless, overly excited and hyperactive, or not being able to sleep. If this happens, especially at the beginning of treatment or after a change in dose, call your health care professional. Dennis Bast may get drowsy or dizzy. Do not drive, use machinery, or do anything that needs mental alertness until you know how this medicine affects you. Do not stand or sit up quickly, especially if you are an older patient. This reduces the risk of dizzy or fainting spells. Alcohol may interfere with the effect of this medicine. Avoid  alcoholic drinks. Your mouth may get dry. Chewing sugarless gum or sucking hard candy, and drinking plenty of water may help. Contact your doctor if the problem does not go away or is severe. What side effects may I notice from receiving this medicine? Side effects that you should report to your doctor or health care professional as soon as possible: -allergic reactions like skin rash, itching or hives, swelling of the face, lips, or tongue -anxious -black, tarry stools -changes in vision -confusion -elevated mood, decreased need for sleep, racing thoughts, impulsive behavior -eye pain -fast, irregular heartbeat -feeling faint or lightheaded, falls -feeling agitated, angry, or irritable -hallucination, loss of contact with reality -loss of balance or coordination -loss of memory -painful or prolonged erections -restlessness, pacing, inability to keep still -seizures -stiff muscles -suicidal thoughts or other mood changes -trouble sleeping -unusual bleeding or bruising -unusually weak or tired -vomiting Side effects that usually do not require medical attention (report to your doctor or health care professional if they continue or are bothersome): -change in appetite or weight -change in sex drive or performance -diarrhea -increased sweating -indigestion, nausea -tremors This list may not describe all possible side effects. Call your doctor for medical advice about side effects. You may report side effects to FDA at 1-800-FDA-1088. Where should I keep my medicine? Keep out of the reach of children. Store at  room temperature between 15 and 30 degrees C (59 and 86 degrees F). Throw away any unused medicine after the expiration date. NOTE: This sheet is a summary. It may not cover all possible information. If you have questions about this medicine, talk to your doctor, pharmacist, or health care provider.  2017 Elsevier/Gold Standard (2016-03-06 15:54:02)   Please take  medications as directed.  If you develop any thoughts of harming oneself or others -STOP medication and come to clinic immediately. Increase regular exercise and continue a healthy diet. Please return in 1 month, or sooner if needed.

## 2016-11-18 NOTE — Assessment & Plan Note (Signed)
Start Losartan/HCTZ.  Reduce sodium intake and increase daily water consumption.

## 2016-11-18 NOTE — Progress Notes (Signed)
Subjective:    Patient ID: Anthony Garcia, male    DOB: Mar 29, 1976, 41 y.o.   MRN: [id number]  HPI:  Anthony Garcia presents for f/u for palpitations and assistance with tobacco cessation.  He was seen at ED last week for palpitations and pain behind left knee.  Negative cardiac work-up and negative for DVT of LLE.  He reports that the palpitations are still occurring intermittently and are worrisome. He has had exhaustive cardiac work-up years ago, also negative.  His father died at 26 from CAD/MI and he is concerned he will suffer the same fate.  He recently moved and started a new job, however denies acute anxiety/depression.  He reports eating fairly healthy (fish and chicken most nights of the week) and swimming a day/two a week.  He estimates to smoke 1/2 pack/daily and is ready to quit again (previously has been on Chantix-denies SE and was able to quit!).   Patient Care Team    Relationship Specialty Notifications Start End  Mellody Dance, DO PCP - General Family Medicine  11/11/16     Patient Active Problem List   Diagnosis Date Noted  . Hyperlipidemia 11/08/2016  . Hypertension 11/08/2016  . H/O knee surgery- multiple L knee sx 11/08/2016  . Snores 11/08/2016  . Breath holding episodes 11/08/2016  . Acute reaction to stress 11/08/2016  . medical Noncompliance 11/08/2016  . Tobacco use disorder 11/08/2016  . Tobacco abuse counseling 11/08/2016  . Palpitations 11/08/2016  . Obesity, Class I, BMI 30-34.9 11/08/2016  . Family history of early CAD 11/08/2016  . Environmental and seasonal allergies 11/08/2016     Past Medical History:  Diagnosis Date  . Hyperlipidemia   . Hypertension      Past Surgical History:  Procedure Laterality Date  . ANTERIOR CRUCIATE LIGAMENT REPAIR Left   . KNEE ARTHROSCOPY Left      Family History  Problem Relation Age of Onset  . Heart attack Father   . Hyperlipidemia Father   . Hypertension Father   . Hyperlipidemia Brother   .  Hypertension Brother   . Healthy Son   . Healthy Son   . Healthy Son      History  Drug Use No     History  Alcohol Use No     History  Smoking Status  . Current Every Day Smoker  . Packs/day: 0.50  . Years: 15.00  Smokeless Tobacco  . Never Used     Outpatient Encounter Prescriptions as of 11/18/2016  Medication Sig  . atorvastatin (LIPITOR) 40 MG tablet Take 40 mg by mouth daily.  Marland Kitchen losartan-hydrochlorothiazide (HYZAAR) 50-12.5 MG tablet Take 1 tablet by mouth daily.  . sertraline (ZOLOFT) 50 MG tablet Take 1 tablet (50 mg total) by mouth daily.  . varenicline (CHANTIX STARTING MONTH PAK) 0.5 MG X 11 & 1 MG X 42 tablet Take one 0.5 mg tablet by mouth once daily for 3 days, then increase to one 0.5 mg tablet twice daily for 4 days, then increase to one 1 mg tablet twice daily.  . Vitamin D, Ergocalciferol, (DRISDOL) 50000 units CAPS capsule Take 1 capsule (50,000 Units total) by mouth every 7 (seven) days. (Patient not taking: Reported on 11/18/2016)   No facility-administered encounter medications on file as of 11/18/2016.     Allergies: Patient has no known allergies.  Body mass index is 31.23 kg/m.  Blood pressure (!) 143/84, pulse 89, height 6' 1.25" (1.861 m), weight 238 lb 4.8 oz (108.1 kg).  Review of Systems  Constitutional: Positive for activity change and fatigue. Negative for appetite change, chills, diaphoresis, fever and unexpected weight change.  HENT: Negative for congestion.   Eyes: Negative for visual disturbance.  Respiratory: Negative for cough, chest tightness and shortness of breath.   Cardiovascular: Positive for palpitations. Negative for chest pain and leg swelling.  Gastrointestinal: Negative for abdominal distention, constipation and diarrhea.  Endocrine: Negative for cold intolerance, heat intolerance, polydipsia, polyphagia and polyuria.  Genitourinary: Negative for difficulty urinating.  Musculoskeletal: Negative for joint swelling.        Denies pain behind left knee at time of encounter.  Skin: Negative for color change, pallor, rash and wound.  Neurological: Positive for dizziness. Negative for tremors, weakness and light-headedness.  Psychiatric/Behavioral: Negative for agitation, behavioral problems, confusion, decreased concentration, dysphoric mood, hallucinations, self-injury, sleep disturbance and suicidal ideas. The patient is not nervous/anxious.        Objective:   Physical Exam  Constitutional: He is oriented to person, place, and time. He appears well-developed and well-nourished. No distress.  HENT:  Head: Normocephalic and atraumatic.  Right Ear: External ear normal.  Left Ear: External ear normal.  Eyes: Conjunctivae and EOM are normal. Pupils are equal, round, and reactive to light.  Neck: Normal range of motion. Neck supple. No thyromegaly present.  Cardiovascular: Normal rate, regular rhythm and intact distal pulses.  Exam reveals no gallop and no friction rub.   No murmur heard. Pulmonary/Chest: He is in respiratory distress. He has no wheezes. He has no rales. He exhibits no tenderness.  Musculoskeletal: Normal range of motion. He exhibits no edema, tenderness or deformity.  Varicose veins noted behind left knee.  No excessive warmth, redness, or cycts/masses appreciated.   Lymphadenopathy:    He has cervical adenopathy.  Neurological: He is alert and oriented to person, place, and time. He has normal reflexes.  Skin: Skin is warm and dry. No rash noted. He is not diaphoretic. No erythema. No pallor.  Psychiatric: He has a normal mood and affect. His behavior is normal. Judgment and thought content normal.  Nursing note and vitals reviewed.         Assessment & Plan:   1. Tobacco abuse counseling   2. Palpitations   3. Hyperlipidemia, unspecified hyperlipidemia type   4. Essential hypertension     Hypertension Start Losartan/HCTZ.  Reduce sodium intake and increase daily water  consumption.  Tobacco abuse counseling Take Chantix as directed.  If any thoughts of harming self/others, immediately stop taking and come into clinic.  Palpitations Increase water intake.  Increase regular exercise.  If palpitations persist/increase then referral to cardiologist.    FOLLOW-UP:  Return in about 1 month (around 12/16/2016) for Evaluate Medication Effectiveness, Fasting Lab Draw.

## 2016-11-18 NOTE — Assessment & Plan Note (Signed)
Increase water intake.  Increase regular exercise.  If palpitations persist/increase then referral to cardiologist.

## 2016-12-16 ENCOUNTER — Ambulatory Visit (INDEPENDENT_AMBULATORY_CARE_PROVIDER_SITE_OTHER): Payer: BLUE CROSS/BLUE SHIELD | Admitting: Adult Health

## 2016-12-16 ENCOUNTER — Encounter: Payer: Self-pay | Admitting: Adult Health

## 2016-12-16 VITALS — BP 137/84 | HR 82 | Ht 73.25 in | Wt 236.7 lb

## 2016-12-16 DIAGNOSIS — Z9119 Patient's noncompliance with other medical treatment and regimen: Secondary | ICD-10-CM | POA: Diagnosis not present

## 2016-12-16 DIAGNOSIS — R002 Palpitations: Secondary | ICD-10-CM | POA: Diagnosis not present

## 2016-12-16 DIAGNOSIS — E785 Hyperlipidemia, unspecified: Secondary | ICD-10-CM | POA: Diagnosis not present

## 2016-12-16 DIAGNOSIS — Z8659 Personal history of other mental and behavioral disorders: Secondary | ICD-10-CM

## 2016-12-16 DIAGNOSIS — I10 Essential (primary) hypertension: Secondary | ICD-10-CM | POA: Diagnosis not present

## 2016-12-16 DIAGNOSIS — F17200 Nicotine dependence, unspecified, uncomplicated: Secondary | ICD-10-CM

## 2016-12-16 DIAGNOSIS — Z91199 Patient's noncompliance with other medical treatment and regimen due to unspecified reason: Secondary | ICD-10-CM

## 2016-12-16 MED ORDER — VARENICLINE TARTRATE 1 MG PO TABS: 1.0000 mg | ORAL_TABLET | Freq: Two times a day (BID) | ORAL | 1 refills | Status: DC

## 2016-12-16 MED ORDER — LOSARTAN POTASSIUM-HCTZ 50-12.5 MG PO TABS: 1.0000 | ORAL_TABLET | Freq: Every day | ORAL | 1 refills | Status: DC

## 2016-12-16 MED ORDER — ALPRAZOLAM 1 MG PO TABS: 1.0000 mg | ORAL_TABLET | ORAL | 0 refills | Status: DC | PRN

## 2016-12-16 NOTE — Assessment & Plan Note (Signed)
Cholesterol panel checked today. Once resulted, will determine if he needs to re-start Atorvastatin.

## 2016-12-16 NOTE — Assessment & Plan Note (Signed)
Last tobacco use > 5 fays ago. Now on Mx dose of varenicline. Given 30 day supply with 1 refill-none authorized after that.  Will be at max use of 11 weeks.

## 2016-12-16 NOTE — Progress Notes (Signed)
5

## 2016-12-16 NOTE — Assessment & Plan Note (Signed)
Discussed importance of taking medications as directed. Verbalized understanding/agreement.

## 2016-12-16 NOTE — Assessment & Plan Note (Signed)
Essentially resolved. Reports "maybe one-two a week now". Denies CP/dyspnea. Will continue to monitor.

## 2016-12-16 NOTE — Addendum Note (Signed)
Addended by: Fonnie Mu on: 12/16/2016 09:10 AM   Modules accepted: Orders

## 2016-12-16 NOTE — Assessment & Plan Note (Signed)
Start Losartan/HCTZ. Continue to reduce sodium and increase regular movement. If able to lose 10% of body weight, very likely to d/c medication. GREAT JOB ON SMOKING CESSATION!

## 2016-12-16 NOTE — Assessment & Plan Note (Signed)
Anxiety r/t work stress, esp. 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 14 tablets of Alprazolam provided.

## 2016-12-16 NOTE — Patient Instructions (Signed)
Hypertension Hypertension, commonly called high blood pressure, is when the force of blood pumping through the arteries is too strong. The arteries are the blood vessels that carry blood from the heart throughout the body. Hypertension forces the heart to work harder to pump blood and may cause arteries to become narrow or stiff. Having untreated or uncontrolled hypertension can cause heart attacks, strokes, kidney disease, and other problems. A blood pressure reading consists of a higher number over a lower number. Ideally, your blood pressure should be below 120/80. The first ("top") number is called the systolic pressure. It is a measure of the pressure in your arteries as your heart beats. The second ("bottom") number is called the diastolic pressure. It is a measure of the pressure in your arteries as the heart relaxes. What are the causes? The cause of this condition is not known. What increases the risk? Some risk factors for high blood pressure are under your control. Others are not. Factors you can change   Smoking.  Having type 2 diabetes mellitus, high cholesterol, or both.  Not getting enough exercise or physical activity.  Being overweight.  Having too much fat, sugar, calories, or salt (sodium) in your diet.  Drinking too much alcohol. Factors that are difficult or impossible to change   Having chronic kidney disease.  Having a family history of high blood pressure.  Age. Risk increases with age.  Race. You may be at higher risk if you are African-American.  Gender. Men are at higher risk than women before age 45. After age 65, women are at higher risk than men.  Having obstructive sleep apnea.  Stress. What are the signs or symptoms? Extremely high blood pressure (hypertensive crisis) may cause:  Headache.  Anxiety.  Shortness of breath.  Nosebleed.  Nausea and vomiting.  Severe chest pain.  Jerky movements you cannot control (seizures). How is this  diagnosed? This condition is diagnosed by measuring your blood pressure while you are seated, with your arm resting on a surface. The cuff of the blood pressure monitor will be placed directly against the skin of your upper arm at the level of your heart. It should be measured at least twice using the same arm. Certain conditions can cause a difference in blood pressure between your right and left arms. Certain factors can cause blood pressure readings to be lower or higher than normal (elevated) for a short period of time:  When your blood pressure is higher when you are in a health care provider's office than when you are at home, this is called white coat hypertension. Most people with this condition do not need medicines.  When your blood pressure is higher at home than when you are in a health care provider's office, this is called masked hypertension. Most people with this condition may need medicines to control blood pressure. If you have a high blood pressure reading during one visit or you have normal blood pressure with other risk factors:  You may be asked to return on a different day to have your blood pressure checked again.  You may be asked to monitor your blood pressure at home for 1 week or longer. If you are diagnosed with hypertension, you may have other blood or imaging tests to help your health care provider understand your overall risk for other conditions. How is this treated? This condition is treated by making healthy lifestyle changes, such as eating healthy foods, exercising more, and reducing your alcohol intake. Your health   care provider may prescribe medicine if lifestyle changes are not enough to get your blood pressure under control, and if:  Your systolic blood pressure is above 130.  Your diastolic blood pressure is above 80. Your personal target blood pressure may vary depending on your medical conditions, your age, and other factors. Follow these instructions  at home: Eating and drinking   Eat a diet that is high in fiber and potassium, and low in sodium, added sugar, and fat. An example eating plan is called the DASH (Dietary Approaches to Stop Hypertension) diet. To eat this way:  Eat plenty of fresh fruits and vegetables. Try to fill half of your plate at each meal with fruits and vegetables.  Eat whole grains, such as whole wheat pasta, brown rice, or whole grain bread. Fill about one quarter of your plate with whole grains.  Eat or drink low-fat dairy products, such as skim milk or low-fat yogurt.  Avoid fatty cuts of meat, processed or cured meats, and poultry with skin. Fill about one quarter of your plate with lean proteins, such as fish, chicken without skin, beans, eggs, and tofu.  Avoid premade and processed foods. These tend to be higher in sodium, added sugar, and fat.  Reduce your daily sodium intake. Most people with hypertension should eat less than 1,500 mg of sodium a day.  Limit alcohol intake to no more than 1 drink a day for nonpregnant women and 2 drinks a day for men. One drink equals 12 oz of beer, 5 oz of wine, or 1 oz of hard liquor. Lifestyle   Work with your health care provider to maintain a healthy body weight or to lose weight. Ask what an ideal weight is for you.  Get at least 30 minutes of exercise that causes your heart to beat faster (aerobic exercise) most days of the week. Activities may include walking, swimming, or biking.  Include exercise to strengthen your muscles (resistance exercise), such as pilates or lifting weights, as part of your weekly exercise routine. Try to do these types of exercises for 30 minutes at least 3 days a week.  Do not use any products that contain nicotine or tobacco, such as cigarettes and e-cigarettes. If you need help quitting, ask your health care provider.  Monitor your blood pressure at home as told by your health care provider.  Keep all follow-up visits as told by  your health care provider. This is important. Medicines   Take over-the-counter and prescription medicines only as told by your health care provider. Follow directions carefully. Blood pressure medicines must be taken as prescribed.  Do not skip doses of blood pressure medicine. Doing this puts you at risk for problems and can make the medicine less effective.  Ask your health care provider about side effects or reactions to medicines that you should watch for. Contact a health care provider if:  You think you are having a reaction to a medicine you are taking.  You have headaches that keep coming back (recurring).  You feel dizzy.  You have swelling in your ankles.  You have trouble with your vision. Get help right away if:  You develop a severe headache or confusion.  You have unusual weakness or numbness.  You feel faint.  You have severe pain in your chest or abdomen.  You vomit repeatedly.  You have trouble breathing. Summary  Hypertension is when the force of blood pumping through your arteries is too strong. If this condition is   not controlled, it may put you at risk for serious complications.  Your personal target blood pressure may vary depending on your medical conditions, your age, and other factors. For most people, a normal blood pressure is less than 120/80.  Hypertension is treated with lifestyle changes, medicines, or a combination of both. Lifestyle changes include weight loss, eating a healthy, low-sodium diet, exercising more, and limiting alcohol. This information is not intended to replace advice given to you by your health care provider. Make sure you discuss any questions you have with your health care provider. Document Released: 10/04/2005 Document Revised: 09/01/2016 Document Reviewed: 09/01/2016 Elsevier Interactive Patient Education  2017 [employer].   Heart-Healthy Eating Plan Many factors influence your heart health, including eating and  exercise habits. Heart (coronary) risk increases with abnormal blood fat (lipid) levels. Heart-healthy meal planning includes limiting unhealthy fats, increasing healthy fats, and making other small dietary changes. This includes maintaining a healthy body weight to help keep lipid levels within a normal range. What is my plan? Your health care provider recommends that you:  Get no more than _________% of the total calories in your daily diet from fat.  Limit your intake of saturated fat to less than _________% of your total calories each day.  Limit the amount of cholesterol in your diet to less than _________ mg per day. What types of fat should I choose?  Choose healthy fats more often. Choose monounsaturated and polyunsaturated fats, such as olive oil and canola oil, flaxseeds, walnuts, almonds, and seeds.  Eat more omega-3 fats. Good choices include salmon, mackerel, sardines, tuna, flaxseed oil, and ground flaxseeds. Aim to eat fish at least two times each week.  Limit saturated fats. Saturated fats are primarily found in animal products, such as meats, butter, and cream. Plant sources of saturated fats include palm oil, palm kernel oil, and coconut oil.  Avoid foods with partially hydrogenated oils in them. These contain trans fats. Examples of foods that contain trans fats are stick margarine, some tub margarines, cookies, crackers, and other baked goods. What general guidelines do I need to follow?  Check food labels carefully to identify foods with trans fats or high amounts of saturated fat.  Fill one half of your plate with vegetables and green salads. Eat 4-5 servings of vegetables per day. A serving of vegetables equals 1 cup of raw leafy vegetables,  cup of raw or cooked cut-up vegetables, or  cup of vegetable juice.  Fill one fourth of your plate with whole grains. Look for the word "whole" as the first word in the ingredient list.  Fill one fourth of your plate with lean  protein foods.  Eat 4-5 servings of fruit per day. A serving of fruit equals one medium whole fruit,  cup of dried fruit,  cup of fresh, frozen, or canned fruit, or  cup of 100% fruit juice.  Eat more foods that contain soluble fiber. Examples of foods that contain this type of fiber are apples, broccoli, carrots, beans, peas, and barley. Aim to get 20-30 g of fiber per day.  Eat more home-cooked food and less restaurant, buffet, and fast food.  Limit or avoid alcohol.  Limit foods that are high in starch and sugar.  Avoid fried foods.  Cook foods by using methods other than frying. Baking, boiling, grilling, and broiling are all great options. Other fat-reducing suggestions include:  Removing the skin from poultry.  Removing all visible fats from meats.  Skimming the fat off  of stews, soups, and gravies before serving them.  Steaming vegetables in water or broth.  Lose weight if you are overweight. Losing just 5-10% of your initial body weight can help your overall health and prevent diseases such as diabetes and heart disease.  Increase your consumption of nuts, legumes, and seeds to 4-5 servings per week. One serving of dried beans or legumes equals  cup after being cooked, one serving of nuts equals 1 ounces, and one serving of seeds equals  ounce or 1 tablespoon.  You may need to monitor your salt (sodium) intake, especially if you have high blood pressure. Talk with your health care provider or dietitian to get more information about reducing sodium. What foods can I eat? Grains   Breads, including [place], white, pita, wheat, raisin, rye, oatmeal, and [place]. Tortillas that are neither fried nor made with lard or trans fat. Low-fat rolls, including hotdog and hamburger buns and English muffins. Biscuits. Muffins. Waffles. Pancakes. Light popcorn. Whole-grain cereals. Flatbread. Melba toast. Pretzels. Breadsticks. Rusks. Low-fat snacks and crackers, including oyster,  saltine, matzo, graham, animal, and rye. Rice and pasta, including brown rice and those that are made with whole wheat. Vegetables  All vegetables. Fruits  All fruits, but limit coconut. Meats and Other Protein Sources  Lean, well-trimmed beef, veal, pork, and lamb. Chicken and [place] without skin. All fish and shellfish. Wild duck, rabbit, pheasant, and venison. Egg whites or low-cholesterol egg substitutes. Dried beans, peas, lentils, and tofu.Seeds and most nuts. Dairy  Low-fat or nonfat cheeses, including ricotta, string, and mozzarella. Skim or 1% milk that is liquid, powdered, or evaporated. Buttermilk that is made with low-fat milk. Nonfat or low-fat yogurt. Beverages  Mineral water. Diet carbonated beverages. Sweets and Desserts  Sherbets and fruit ices. Honey, jam, marmalade, jelly, and syrups. Meringues and gelatins. Pure sugar candy, such as hard candy, jelly beans, gumdrops, mints, marshmallows, and small amounts of dark chocolate. [employer] Eat all sweets and desserts in moderation. Fats and Oils  Nonhydrogenated (trans-free) margarines. Vegetable oils, including soybean, sesame, sunflower, olive, peanut, safflower, corn, canola, and cottonseed. Salad dressings or mayonnaise that are made with a vegetable oil. Limit added fats and oils that you use for cooking, baking, salads, and as spreads. Other  Cocoa powder. Coffee and tea. All seasonings and condiments. The items listed above may not be a complete list of recommended foods or beverages. Contact your dietitian for more options.  What foods are not recommended? Grains  Breads that are made with saturated or trans fats, oils, or whole milk. Croissants. Butter rolls. Cheese breads. Sweet rolls. Donuts. Buttered popcorn. Chow mein noodles. High-fat crackers, such as cheese or butter crackers. Meats and Other Protein Sources  Fatty meats, such as hotdogs, short ribs, sausage, spareribs, bacon, ribeye roast or steak, and  mutton. High-fat deli meats, such as salami and bologna. Caviar. Domestic duck and goose. Organ meats, such as kidney, liver, sweetbreads, brains, gizzard, chitterlings, and heart. Dairy  Cream, sour cream, cream cheese, and creamed cottage cheese. Whole milk cheeses, including blue (bleu), Monte[place] Jack, [place], [place], American, [place], Swiss, [place], [place], and [place]. Whole or 2% milk that is liquid, evaporated, or condensed. Whole buttermilk. Cream sauce or high-fat cheese sauce. Yogurt that is made from whole milk. Beverages  Regular sodas and drinks with added sugar. Sweets and Desserts  Frosting. Pudding. Cookies. Cakes other than angel food cake. Candy that has milk chocolate or white chocolate, hydrogenated fat, butter, coconut, or unknown ingredients. Buttered  syrups. Full-fat ice cream or ice cream drinks. Fats and Oils  Gravy that has suet, meat fat, or shortening. Cocoa butter, hydrogenated oils, palm oil, coconut oil, palm kernel oil. These can often be found in baked products, candy, fried foods, nondairy creamers, and whipped toppings. Solid fats and shortenings, including bacon fat, salt pork, lard, and butter. Nondairy cream substitutes, such as coffee creamers and sour cream substitutes. Salad dressings that are made of unknown oils, cheese, or sour cream. The items listed above may not be a complete list of foods and beverages to avoid. Contact your dietitian for more information.  This information is not intended to replace advice given to you by your health care provider. Make sure you discuss any questions you have with your health care provider. Document Released: 07/13/2008 Document Revised: 04/23/2016 Document Reviewed: 03/28/2014 Elsevier Interactive Patient Education  2017 [employer].  Please start blood pressure medication. Once we receive your cholesterol panel, we will determine if you need the Atorvastatin. GREAT JOB ON THE SMOKING CESSATION! Continue healthy  eating and regular movement. Please give [place] a call in 2 months after you finish the Chantix. Follow-up as needed.

## 2016-12-16 NOTE — Progress Notes (Signed)
Subjective:    Patient ID: Anthony Garcia, male    DOB: [birth year], 41 y.o.   MRN: [id number]  HPI:  Anthony Garcia presents for f/u on HTN, smoking cessation, palpitations, and acute anxiety.  He successfully stopped smoking-GREAT JOB!!! Last tobacco use > 5 days ago.  He denies any medication SE, and "is really enjoying the vivid dreams".  He requests refill on Chantix and feels confident about avoiding tobacco.  He has not started the Losartan/HCTZ or Atorvastatin.  He has increased his regular exercise and continued to reduce saturated fat/sugar/CHO intake.  He reports that the palpitations have "almost completely stopped and the pain behind my knee has resolved".  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  He has used alprazolam in the past without SEs "and it worked really well".  He vehemently declines use of Sertraline and feels that "his anxiety is brief and situational".     Patient Care Team    Relationship Specialty Notifications Start End  Mellody Dance, DO PCP - General Family Medicine  11/11/16     Patient Active Problem List   Diagnosis Date Noted  . Hyperlipidemia 11/08/2016  . Hypertension 11/08/2016  . H/O knee surgery- multiple L knee sx 11/08/2016  . Snores 11/08/2016  . Breath holding episodes 11/08/2016  . Acute reaction to stress 11/08/2016  . medical Noncompliance 11/08/2016  . Tobacco use disorder 11/08/2016  . Tobacco abuse counseling 11/08/2016  . Palpitations 11/08/2016  . Obesity, Class I, BMI 30-34.9 11/08/2016  . Family history of early CAD 11/08/2016  . Environmental and seasonal allergies 11/08/2016     Past Medical History:  Diagnosis Date  . Hyperlipidemia   . Hypertension      Past Surgical History:  Procedure Laterality Date  . ANTERIOR CRUCIATE LIGAMENT REPAIR Left   . KNEE ARTHROSCOPY Left      Family History  Problem Relation Age of Onset  . Heart attack Father   .  Hyperlipidemia Father   . Hypertension Father   . Hyperlipidemia Brother   . Hypertension Brother   . Healthy Son   . Healthy Son   . Healthy Son      History  Drug Use No     History  Alcohol Use No     History  Smoking Status  . Current Every Day Smoker  . Packs/day: 0.50  . Years: 15.00  Smokeless Tobacco  . Never Used     Outpatient Encounter Prescriptions as of 12/16/2016  Medication Sig  . Vitamin D, Ergocalciferol, (DRISDOL) 50000 units CAPS capsule Take 1 capsule (50,000 Units total) by mouth every 7 (seven) days.  . [DISCONTINUED] varenicline (CHANTIX STARTING MONTH PAK) 0.5 MG X 11 & 1 MG X 42 tablet Take one 0.5 mg tablet by mouth once daily for 3 days, then increase to one 0.5 mg tablet twice daily for 4 days, then increase to one 1 mg tablet twice daily.  Marland Kitchen ALPRAZolam (XANAX) 1 MG tablet Take 1 tablet (1 mg total) by mouth as needed for anxiety (May repeat in 8 hrs if needed.).  Marland Kitchen atorvastatin (LIPITOR) 40 MG tablet Take 40 mg by mouth daily.  Marland Kitchen losartan-hydrochlorothiazide (HYZAAR) 50-12.5 MG tablet Take 1 tablet by mouth daily.  . varenicline (CHANTIX) 1 MG tablet Take 1 tablet (1 mg total) by mouth 2 (two) times daily.  . [DISCONTINUED] losartan-hydrochlorothiazide (HYZAAR) 50-12.5 MG tablet Take 1 tablet by mouth daily.  . [  DISCONTINUED] sertraline (ZOLOFT) 50 MG tablet Take 1 tablet (50 mg total) by mouth daily.   No facility-administered encounter medications on file as of 12/16/2016.     Allergies: Patient has no known allergies.  Body mass index is 31.02 kg/m.  Blood pressure 137/84, pulse 82, height 6' 1.25" (1.861 m), weight 236 lb 11.2 oz (107.4 kg).     Review of Systems  Constitutional: Negative for activity change, appetite change, chills, diaphoresis, fatigue, fever and unexpected weight change.  Eyes: Negative for visual disturbance.  Respiratory: Negative for cough and shortness of breath.   Cardiovascular: Negative for chest pain,  palpitations and leg swelling.  Gastrointestinal: Negative for abdominal distention, abdominal pain, constipation, diarrhea, nausea and vomiting.  Endocrine: Negative for cold intolerance, heat intolerance, polydipsia, polyphagia and polyuria.  Genitourinary: Negative for difficulty urinating and flank pain.  Musculoskeletal: Positive for arthralgias and back pain. Negative for gait problem, joint swelling and myalgias.  Skin: Negative for color change, pallor, rash and wound.  Neurological: Negative for dizziness, tremors, weakness and light-headedness.  Psychiatric/Behavioral: Negative for agitation, behavioral problems, confusion, decreased concentration, dysphoric mood, hallucinations, self-injury, sleep disturbance and suicidal ideas. The patient is not nervous/anxious and is not hyperactive.        Objective:   Physical Exam  Constitutional: He is oriented to person, place, and time. He appears well-developed and well-nourished. No distress.  HENT:  Head: Normocephalic and atraumatic.  Eyes: Conjunctivae and EOM are normal. Pupils are equal, round, and reactive to light.  Neck: Normal range of motion. Neck supple.  Cardiovascular: Normal rate, regular rhythm, normal heart sounds and intact distal pulses.   No murmur heard. Pulmonary/Chest: Effort normal and breath sounds normal. No respiratory distress. He has no wheezes. He has no rales. He exhibits no tenderness.  Abdominal: Soft. Bowel sounds are normal.  Musculoskeletal: Normal range of motion.  Neurological: He is alert and oriented to person, place, and time.  Skin: Skin is warm and dry. No rash noted. He is not diaphoretic. No erythema. No pallor.  Psychiatric: He has a normal mood and affect. His behavior is normal. Judgment and thought content normal.  Nursing note and vitals reviewed.         Assessment & Plan:   1. Hyperlipidemia, unspecified hyperlipidemia type   2. medical Noncompliance   3. Palpitations   4.  History of episode of anxiety   5. Essential hypertension   6. Tobacco use disorder     Hypertension Start Losartan/HCTZ. Continue to reduce sodium and increase regular movement. If able to lose 10% of body weight, very likely to d/c medication. GREAT JOB ON SMOKING CESSATION!  Hyperlipidemia Cholesterol panel checked today. Once resulted, will determine if he needs to re-start Atorvastatin.  medical Noncompliance Discussed importance of taking medications as directed. Verbalized understanding/agreement.  Palpitations Essentially resolved. Reports "maybe one-two a week now". Denies CP/dyspnea. Will continue to monitor.  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 14 tablets of Alprazolam provided.   Tobacco use disorder Last tobacco use > 5 fays ago. Now on Mx dose of varenicline. Given 30 day supply with 1 refill-none authorized after that.  Will be at max use of 11 weeks.    FOLLOW-UP:  Return if symptoms worsen or fail to improve.

## 2016-12-17 LAB — LIPID PANEL
CHOLESTEROL TOTAL: 306 mg/dL — AB (ref 100–199)
Chol/HDL Ratio: 8.1 ratio units — ABNORMAL HIGH (ref 0.0–5.0)
HDL: 38 mg/dL — ABNORMAL LOW (ref >39)
LDL CALC: 226 mg/dL — AB (ref 0–99)
Triglycerides: 210 mg/dL — ABNORMAL HIGH (ref 0–149)
VLDL CHOLESTEROL CAL: 42 mg/dL — AB (ref 5–40)

## 2016-12-20 ENCOUNTER — Other Ambulatory Visit: Payer: Self-pay

## 2016-12-20 ENCOUNTER — Other Ambulatory Visit: Payer: Self-pay | Admitting: Adult Health

## 2016-12-20 DIAGNOSIS — Z79899 Other long term (current) drug therapy: Secondary | ICD-10-CM

## 2016-12-20 DIAGNOSIS — E785 Hyperlipidemia, unspecified: Secondary | ICD-10-CM

## 2016-12-20 MED ORDER — ATORVASTATIN CALCIUM 40 MG PO TABS: 40.0000 mg | ORAL_TABLET | Freq: Every day | ORAL | 0 refills | Status: DC

## 2016-12-20 NOTE — Progress Notes (Unsigned)
89

## 2017-01-03 ENCOUNTER — Ambulatory Visit: Payer: BLUE CROSS/BLUE SHIELD | Admitting: Cardiovascular Disease

## 2017-01-04 ENCOUNTER — Encounter: Payer: Self-pay | Admitting: Cardiovascular Disease

## 2017-01-04 ENCOUNTER — Ambulatory Visit (INDEPENDENT_AMBULATORY_CARE_PROVIDER_SITE_OTHER): Payer: BLUE CROSS/BLUE SHIELD | Admitting: Cardiovascular Disease

## 2017-01-04 VITALS — BP 121/80 | HR 85 | Ht 74.0 in | Wt 231.0 lb

## 2017-01-04 DIAGNOSIS — E785 Hyperlipidemia, unspecified: Secondary | ICD-10-CM | POA: Diagnosis not present

## 2017-01-04 DIAGNOSIS — R002 Palpitations: Secondary | ICD-10-CM | POA: Diagnosis not present

## 2017-01-04 DIAGNOSIS — I10 Essential (primary) hypertension: Secondary | ICD-10-CM | POA: Diagnosis not present

## 2017-01-04 DIAGNOSIS — Z8249 Family history of ischemic heart disease and other diseases of the circulatory system: Secondary | ICD-10-CM | POA: Diagnosis not present

## 2017-01-04 DIAGNOSIS — R0689 Other abnormalities of breathing: Secondary | ICD-10-CM | POA: Diagnosis not present

## 2017-01-04 DIAGNOSIS — Z87891 Personal history of nicotine dependence: Secondary | ICD-10-CM

## 2017-01-04 DIAGNOSIS — R400 Somnolence: Secondary | ICD-10-CM

## 2017-01-04 DIAGNOSIS — G478 Other sleep disorders: Secondary | ICD-10-CM

## 2017-01-04 MED ORDER — METOPROLOL TARTRATE 50 MG PO TABS: ORAL_TABLET | ORAL | 3 refills | Status: DC

## 2017-01-04 NOTE — Progress Notes (Signed)
Cardiology Office Note    Date:  01/04/2017   ID:  [name], DOB [birth year], MRN [id number]  PCP:  Mellody Dance, DO  Cardiologist:  Shelva Majestic, MD   Chief Complaint  Patient presents with  . Anthony Evaluation    pt c/o palpitations about 1 month ago Anthony sleep apnea Anthony swelling in back of leg    History of Present Illness:  Anthony Garcia is a 41 y.o. male is referred for cardiology consultation through the request of Dr. Everette Rank for evaluation of palpitations, hyperlipidemia, Anthony difficulty with sleep.  Anthony Garcia was born in [place].  Anthony father was in [employer] as result, the patient lived in numerous places growing up, but he regards [place] as the place that he called home.  Anthony father, who was very active, died suddenly from a heart attack at age 6.  He had a history of hyperlipidemia.  The patient states that over the past 20 years.  he has smoked off Anthony on Anthony for the past 21 days has been on Chantix Anthony has not been smoking.  Prior to  moving to [place]  he was living in [place].  While in [place], he had some symptoms of vague chest pain or palpitations Anthony apparently saw a cardiologist on one occasion Anthony had an exercise stress echo.  He was placed on medication for hypertension losartan HCT, Anthony also was started on atorvastatin.  When Anthony prescriptions ran out, he had stopped taking the medication.  For the past year, before moving to [place] he had lived in [place] , but did not seek medical attention.  Recently, he has noticed episodes of palpitations.  He denies that he is under significant increased stress.  He has 3 small children.  Because of palpitations, he was seen by Dr. Ralph Dowdy.  The patient states he was told that it was most likely due to anxiety.  He tells me she had wanted to prescribe Zoloft but he refused.  Later that night, he had recurrent symptoms Anthony presented for emergency room evaluation.  No ectopy was detected.  He also had issues with  lower extremity swelling.  A venous lower extremity ultrasound was negative for deep vein thrombosis.  Patient had been started back on losartan HCT 50/12.5 as well as atorvastatin 40 mg.  Baseline laboratory revealed a total cholesterol of 306, triglycerides 210, HDL 38, VLDL 42, Anthony LDL cholesterol at 226.  The patient states over the past several weeks he has dramatically changed Anthony diet, eating very poorly previously.  Anthony palpitations have improved.  He denies significant caffeine use.  However, he still notes an occasional episode where Anthony heart beats fast.  He also has been told that he stops breathing when he is lying on Anthony back asleep.  He was told of snoring loudly.  Recently he has noticed nocturia at least 2-3 times per night.  He admits that he has not been very active.  He had sustained 5 left knee surgeries.  He was referred for evaluation.   Past Medical History:  Diagnosis Date  . Hyperlipidemia   . Hypertension     Past Surgical History:  Procedure Laterality Date  . ANTERIOR CRUCIATE LIGAMENT REPAIR Left   . KNEE ARTHROSCOPY Left     Current Medications: Outpatient Medications Prior to Visit  Medication Sig Dispense Refill  . ALPRAZolam (XANAX) 1 MG tablet Take 1 tablet (1 mg total) by mouth as needed for anxiety (May repeat  in 8 hrs if needed.). 14 tablet 0  . atorvastatin (LIPITOR) 40 MG tablet Take 1 tablet (40 mg total) by mouth daily. 90 tablet 0  . losartan-hydrochlorothiazide (HYZAAR) 50-12.5 MG tablet Take 1 tablet by mouth daily. 90 tablet 1  . varenicline (CHANTIX) 1 MG tablet Take 1 tablet (1 mg total) by mouth 2 (two) times daily. 30 tablet 1  . Vitamin D, Ergocalciferol, (DRISDOL) 50000 units CAPS capsule Take 1 capsule (50,000 Units total) by mouth every 7 (seven) days. 8 capsule 2   No facility-administered medications prior to visit.      Allergies:   Patient has no known allergies.   Social History   Social History  . Marital status: Married     Spouse name: N/A  . Number of children: N/A  . Years of education: N/A   Social History Main Topics  . Smoking status: Former Smoker    Packs/day: 0.50    Years: 15.00  . Smokeless tobacco: Never Used  . Alcohol use No  . Drug use: No  . Sexual activity: Yes   Other Topics Concern  . None   Social History Narrative  . None    Additional social history is notable in that he has been smoking for almost 20 years.  He has been on Chantix now for 3 weeks Anthony has not smoked in 21 days.  He has 3 children, ages 14, 10, Anthony 33.  He is married for 10 years.  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  Family History:  The patient's family history includes Healthy in Anthony Garcia, Garcia, Anthony Garcia; Heart attack in Anthony father; Hyperlipidemia in Anthony brother Anthony father; Hypertension in Anthony brother Anthony father.  According to the patient, the father was very active Anthony healthy Anthony died suddenly with an MI at age 22.  Anthony father had a history of significant hyperlipidemia.  ROS General: Negative; No fevers, chills, or night sweats;  HEENT: Negative; No changes in vision or hearing, sinus congestion, difficulty swallowing Pulmonary: Negative; No cough, wheezing, shortness of breath, hemoptysis Cardiovascular: Positive for palpitations;  No chest pain, presyncope, syncope GI: Negative; No nausea, vomiting, diarrhea, or abdominal pain GU: Negative; No dysuria, hematuria, or difficulty voiding Musculoskeletal: Negative; no myalgias, joint pain, or weakness; history of 5 knee surgeries Hematologic/Oncology: Negative; no easy bruising, bleeding Endocrine: Negative; no heat/cold intolerance; no diabetes Neuro: Negative; no changes in balance, headaches Skin: Negative; No rashes or skin lesions Psychiatric: Negative; No behavioral problems, depression Sleep: Positive for loud snoring, witnessed apnea when lying on Anthony back, 2-3 time per night nocturia, no daytime sleepiness, hypersomnolence, bruxism, restless  legs, hypnogognic hallucinations, no cataplexy Other comprehensive 14 point system review is negative.   PHYSICAL EXAM:   VS:  BP 121/80   Pulse 85   Ht 6' 2"  (1.88 m)   Wt 231 lb (104.8 kg)   BMI 29.66 kg/m     Repeat blood pressure by me 110/70  Wt Readings from Last 3 Encounters:  01/04/17 231 lb (104.8 kg)  12/16/16 236 lb 11.2 oz (107.4 kg)  11/18/16 238 lb 4.8 oz (108.1 kg)    General: Alert, oriented, no distress.  Skin: normal turgor, no rashes, warm Anthony dry HEENT: Normocephalic, atraumatic. Bilateral arcus senilis/arcus cornealis.  Positive xanthelasma over Anthony left eye.  Pupils equal round Anthony reactive to light; sclera anicteric; extraocular muscles intact; Fundi;  No [place] plaque seen.  Disc flat. Nose without nasal septal hypertrophy Mouth/Parynx  benign; Mallinpatti scale 3 Neck: No JVD, no carotid bruits; normal carotid upstroke Lungs: clear to ausculatation Anthony percussion; no wheezing or rales Chest wall: without tenderness to palpitation Heart: PMI not displaced, RRR, s1 s2 normal, 1/6 systolic murmur, no diastolic murmur, no rubs, gallops, thrills, or heaves Abdomen: soft, nontender; no hepatosplenomehaly, BS+; abdominal aorta nontender Anthony not dilated by palpation. Back: no CVA tenderness Pulses 2+ Musculoskeletal: full range of motion, normal strength, no joint deformities Extremities: no clubbing cyanosis or edema, Homan's sign negative  Neurologic: grossly nonfocal; Cranial nerves grossly wnl Psychologic: Normal mood Anthony affect   Studies/Labs Reviewed:   EKG:  EKG is ordered today.  ECG (independently read by me): Normal sinus rhythm at 70 bpm.  Normal intervals.  No ST segment changes.  Recent Labs: BMP Latest Ref Rng & Units 11/11/2016 11/09/2016  Glucose 65 - 99 mg/dL 96 104(H)  BUN 6 - 20 mg/dL 17 23(H)  Creatinine 0.76 - 1.27 mg/dL 1.13 1.03  BUN/Creat Ratio 9 - 20 15 -  Sodium 134 - 144 mmol/L 142 133(L)  Potassium 3.5 - 5.2 mmol/L 4.3  3.3(L)  Chloride 96 - 106 mmol/L 104 100(L)  CO2 18 - 29 mmol/L 25 24  Calcium 8.7 - 10.2 mg/dL 9.2 8.8(L)     Hepatic Function Latest Ref Rng & Units 11/11/2016  Total Protein 6.0 - 8.5 g/dL 7.0  Albumin 3.5 - 5.5 g/dL 4.3  AST 0 - 40 IU/L 17  ALT 0 - 44 IU/L 24  Alk Phosphatase 39 - 117 IU/L 29(L)  Total Bilirubin 0.0 - 1.2 mg/dL 1.2    CBC Latest Ref Rng & Units 11/11/2016 11/09/2016  WBC 3.4 - 10.8 x10E3/uL 7.5 12.0(H)  Hemoglobin 13.0 - 17.0 g/dL - 16.0  Hematocrit 37.5 - 51.0 % 46.2 46.3  Platelets 150 - 379 x10E3/uL 237 238   Lab Results  Component Value Date   MCV 87 11/11/2016   MCV 85.6 11/09/2016   Lab Results  Component Value Date   TSH 0.606 11/11/2016   Lab Results  Component Value Date   HGBA1C 5.9 (H) 11/11/2016     BNP No results found for: BNP  ProBNP No results found for: PROBNP   Lipid Panel     Component Value Date/Time   CHOL 306 (H) 12/16/2016 0845   TRIG 210 (H) 12/16/2016 0845   HDL 38 (L) 12/16/2016 0845   CHOLHDL 8.1 (H) 12/16/2016 0845   LDLCALC 226 (H) 12/16/2016 0845     RADIOLOGY: No results found.   Additional studies/ records that were reviewed today include:  I reviewed the office evaluation from Dr. Raliegh Scarlet, lower extremity venous Doppler study, laboratory, Anthony ER evaluation.    ASSESSMENT:    1. Essential hypertension   2. Hyperlipidemia, unspecified hyperlipidemia type   3. Family history of early CAD   38. Palpitations   5. Breath holding episodes   6. Daytime sleepiness   7. Non-restorative sleep   8. History of tobacco abuse      PLAN:  Mr. Wrage is a 41 year old Caucasian male who has a significant cardiac risk factor profile notable for a 20 year history of tobacco use, premature family history for CAD with Anthony father dying suddenly in Anthony 28s, Anthony recently found to have marked elevation of Anthony lipid studies.  On physical examination, he has arcus cornealis, as well as xanthelasmas.  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  He does not have any  tendon xanthomas.  He has recently been started on atorvastatin 40 mg but I suspect he may require higher dosing Anthony possible combination therapy with Zetia to achieve an LDL less than 70.  Recent literature sig just that subclinical atherosclerosis may even develop with LDLs in the 60s Anthony with Anthony significant hyperlipidemia Anthony father dying suddenly in Anthony 63s.  I would encourage aggressive lipid therapy with target LDL at least less than 70.  He also has experienced episodes of palpitations.  He has a history of hypertension Anthony presently is on losartan HCT 50/12.5.  Anthony ECG today shows sinus rhythm without ectopy.  No ectopy was noted during Anthony prior evaluations.  Since Anthony blood pressure is currently controlled Anthony on repeat by me at 110/70, I will not add daily, beta blocker therapy, but will give him a prescription for metoprolol tartrate to take 25 mg on a when necessary basis if he does experience an episode of palpitations/tachydysrhythmia.  I had a very long discussion with him regarding Anthony sleep history.  Anthony symptoms are highly suggestive of obstructive sleep apnea.  I discussed with him at length the cardiovascular risk associated with untreated sleep apnea particularly with reference to blood pressure development, nocturnal arrhythmias, inflammation, potential increased glucose intolerance, Anthony potential for significant oxygen desaturation/MI/TIA/stroke.  I am recommending that he undergo a sleep study for further evaluation.  Weight loss Anthony exercise was recommended which undoubtedly can affect both Anthony blood pressure as well as sleep apnea.  I have recommended a follow-up CMET Anthony lipid panel be obtained in 6 weeks following initiation of Anthony atorvastatin Anthony losartan HCT which was started approximately 3 weeks ago.  I will see him in 3 months for follow-up evaluation Anthony further  recommendations will be made at that time.   Medication Adjustments/Labs Anthony Tests Ordered: Current medicines are reviewed at length with the patient today.  Concerns regarding medicines are outlined above.  Medication changes, Labs Anthony Tests ordered today are listed in the Patient Instructions below. Patient Instructions  Medication Instructions:   1.) start Anthony metoprolol tartrate ( lopressor) prescription as directed on the bottle.  This prescriptions has been sent to your pharmacy.   Labwork: in 6 weeks. Lab slips provided to you today.   Testing/Procedures:  Your physician has recommended that you have a sleep study. This test records several body functions during sleep, including: brain activity, eye movement, oxygen Anthony carbon dioxide blood levels, heart rate Anthony rhythm, breathing rate Anthony rhythm, the flow of air through your mouth Anthony nose, snoring, body muscle movements, Anthony chest Anthony belly movement. This will be done at [facility].    Follow-Up:  3 months.  Any Other Special Instructions Will Be Listed Below (If Applicable).      Signed, Shelva Majestic, MD  01/04/2017 12:42 PM    [facility] [address], [place], [place], [place]  [address] Phone: [phone]

## 2017-01-04 NOTE — Patient Instructions (Addendum)
Medication Instructions:   1.) start new metoprolol tartrate ( lopressor) prescription as directed on the bottle.  This prescriptions has been sent to your pharmacy.   Labwork: in 6 weeks. Lab slips provided to you today.   Testing/Procedures:  Your physician has recommended that you have a sleep study. This test records several body functions during sleep, including: brain activity, eye movement, oxygen and carbon dioxide blood levels, heart rate and rhythm, breathing rate and rhythm, the flow of air through your mouth and nose, snoring, body muscle movements, and chest and belly movement. This will be done at [facility].    Follow-Up:  3 months.  Any Other Special Instructions Will Be Listed Below (If Applicable).

## 2017-01-12 ENCOUNTER — Other Ambulatory Visit: Payer: BLUE CROSS/BLUE SHIELD

## 2017-02-23 ENCOUNTER — Other Ambulatory Visit: Payer: Self-pay | Admitting: *Deleted

## 2017-02-23 ENCOUNTER — Telehealth: Payer: Self-pay | Admitting: *Deleted

## 2017-02-23 DIAGNOSIS — G478 Other sleep disorders: Secondary | ICD-10-CM

## 2017-02-23 DIAGNOSIS — R0683 Snoring: Secondary | ICD-10-CM

## 2017-02-23 DIAGNOSIS — R002 Palpitations: Secondary | ICD-10-CM

## 2017-02-23 DIAGNOSIS — R400 Somnolence: Secondary | ICD-10-CM

## 2017-02-23 DIAGNOSIS — I10 Essential (primary) hypertension: Secondary | ICD-10-CM

## 2017-02-23 NOTE — Telephone Encounter (Signed)
-----   Message from Elease Etienne sent at 02/22/2017 10:47 AM EDT ----- Regarding: IN LAB STUDY DENIED/ [employer]  Contact: [phone] Good morning this patient did not qualify for a in lab study but  was  Approved for home study .  AUTHORIZATION NUMBER  # [id number] VALID 02/22/2017- THUR- 07/06/20185  for an in  home sleep study.  Doctor Claiborne Billings will have to call and  In order to get the in lab one approved  . This patient is sch for this Friday 02/25/2017 that needs to be cancelled thank you for your help in this matter

## 2017-02-23 NOTE — Telephone Encounter (Signed)
Patient notified lab sleep test cancelled. Insurance would not approve. Home study approval through to 04/22/17. Patient notified home sleep test July 3rd 1:00.

## 2017-02-25 ENCOUNTER — Encounter (HOSPITAL_BASED_OUTPATIENT_CLINIC_OR_DEPARTMENT_OTHER): Payer: BLUE CROSS/BLUE SHIELD

## 2017-02-28 ENCOUNTER — Ambulatory Visit (INDEPENDENT_AMBULATORY_CARE_PROVIDER_SITE_OTHER): Payer: BLUE CROSS/BLUE SHIELD | Admitting: Family Medicine

## 2017-02-28 ENCOUNTER — Encounter: Payer: Self-pay | Admitting: Family Medicine

## 2017-02-28 ENCOUNTER — Other Ambulatory Visit: Payer: BLUE CROSS/BLUE SHIELD

## 2017-02-28 VITALS — BP 118/71 | HR 61 | Temp 98.2°F | Ht 74.0 in | Wt 225.0 lb

## 2017-02-28 DIAGNOSIS — W57XXXA Bitten or stung by nonvenomous insect and other nonvenomous arthropods, initial encounter: Secondary | ICD-10-CM

## 2017-02-28 DIAGNOSIS — B356 Tinea cruris: Secondary | ICD-10-CM

## 2017-02-28 DIAGNOSIS — D229 Melanocytic nevi, unspecified: Secondary | ICD-10-CM | POA: Diagnosis not present

## 2017-02-28 DIAGNOSIS — F430 Acute stress reaction: Secondary | ICD-10-CM | POA: Diagnosis not present

## 2017-02-28 LAB — COMPREHENSIVE METABOLIC PANEL
ALBUMIN: 4.4 g/dL (ref 3.6–5.1)
ALT: 19 U/L (ref 9–46)
AST: 14 U/L (ref 10–40)
Alkaline Phosphatase: 29 U/L — ABNORMAL LOW (ref 40–115)
BILIRUBIN TOTAL: 1 mg/dL (ref 0.2–1.2)
BUN: 16 mg/dL (ref 7–25)
CHLORIDE: 103 mmol/L (ref 98–110)
CO2: 25 mmol/L (ref 20–31)
CREATININE: 1.16 mg/dL (ref 0.60–1.35)
Calcium: 9 mg/dL (ref 8.6–10.3)
Glucose, Bld: 102 mg/dL — ABNORMAL HIGH (ref 65–99)
Potassium: 4.2 mmol/L (ref 3.5–5.3)
SODIUM: 139 mmol/L (ref 135–146)
Total Protein: 6.7 g/dL (ref 6.1–8.1)

## 2017-02-28 LAB — LIPID PANEL
Cholesterol: 107 mg/dL (ref <200)
HDL: 32 mg/dL — ABNORMAL LOW (ref >40)
LDL CALC: 60 mg/dL (ref <100)
TRIGLYCERIDES: 74 mg/dL (ref <150)
Total CHOL/HDL Ratio: 3.3 Ratio (ref <5.0)
VLDL: 15 mg/dL (ref <30)

## 2017-02-28 NOTE — Patient Instructions (Addendum)
If you decide to take ABX- please call us and let us know.  If you dev fevers, chills, muscles aches, jt aches, headaches/ feel ill---> let us know.    We will add on labs to your existing ones drawn earlier today.  We will call you with positive results if need be,     Lyme Disease Lyme disease is an infection that affects many parts of the body, including the skin, joints, and nervous system. It is a bacterial infection that starts from the bite of an infected tick. The infection can spread, and some of the symptoms are similar to the flu. If Lyme disease is not treated, it may cause joint pain, swelling, numbness, problems thinking, fatigue, muscle weakness, and other problems. What are the causes? This condition is caused by bacteria called Borrelia burgdorferi. You can get Lyme disease by being bitten by an infected tick. The tick must be attached to your skin to pass along the infection. Deer often carry infected ticks. What increases the risk? The following factors may make you more likely to develop this condition:  Living in or visiting these areas in the [place]  [place].  The [place].  The [place].  Spending time in wooded or grassy areas.  Being outdoors with exposed skin.  Camping, gardening, hiking, fishing, or hunting outdoors.  Failing to remove a tick from your skin within 3-4 days. What are the signs or symptoms? Symptoms of this condition include:  A round, red rash that surrounds the center of the tick bite. This is the first sign of infection. The center of the rash may be blood colored or have tiny blisters.  Fatigue.  Headache.  Chills and fever.  General achiness.  Joint pain, often in the knees.  Muscle pain.  Swollen lymph glands.  Stiff neck. How is this diagnosed? This condition is diagnosed based on:  Your symptoms and medical history.  A physical exam.  A blood test. How is this treated? The main treatment  for this condition is antibiotic medicine, which is usually taken by mouth (orally). The length of treatment depends on how soon after a tick bite you begin taking the medicine. In some cases, treatment is necessary for several weeks. If the infection is severe, antibiotics may need to be given through an IV tube that is inserted into one of your veins. Follow these instructions at home:  Take your antibiotic medicine as told by your health care provider. Do not stop taking the antibiotic even if you start to feel better.  Ask your health care provider about takinga probiotic in between doses of your antibiotic to help avoid stomach upset or diarrhea.  Check with your health care provider before supplementing your treatment. Many alternative therapies have not been proven and may be harmful to you.  Keep all follow-up visits as told by your health care provider. This is important. How is this prevented? You can become reinfected if you get another tick bite from an infected tick. Take these steps to help prevent an infection:  Cover your skin with light-colored clothing when you are outdoors in the spring and summer months.  Spray clothing and skin with bug spray. The spray should be 20-30% DEET.  Avoid wooded, grassy, and shaded areas.  Remove yard litter, brush, trash, and plants that attract deer and rodents.  Check yourself for ticks when you come indoors.  Wash clothing worn each day.  Check your pets for ticks before they  come inside.  If you find a tick:  Remove it with tweezers.  Clean your hands and the bite area with rubbing alcohol or soap and water. Pregnant women should take special care to avoid tick bites because the infection can be passed along to the fetus. Contact a health care provider if:  You have symptoms after treatment.  You have removed a tick and want to bring it to your health care provider for testing. Get help right away if:  You have an  irregular heartbeat.  You have nerve pain.  Your face feels numb. This information is not intended to replace advice given to you by your health care provider. Make sure you discuss any questions you have with your health care provider. Document Released: 01/10/2001 Document Revised: 05/25/2016 Document Reviewed: 05/25/2016 Elsevier Interactive Patient Education  2017 [place] Itch Jock itch (tinea cruris) is a fungal infection of the skin in the groin area. It is sometimes called ringworm, even though it is not caused by worms. It is caused by a fungus, which is a type of germ that thrives in dark, damp places. Jock itch causes a rash and itching in the groin and upper thigh area. It usually goes away in 2-3 weeks with treatment. What are the causes? The fungus that causes jock itch may be spread by:  Touching a fungus infection elsewhere on your body-such as athlete's foot-and then touching your groin area.  Sharing towels or clothing with an infected person. What increases the risk? Jock itch is most common in men and adolescent boys. This condition is more likely to develop from:  Being in hot, humid climates.  Wearing tight-fitting clothing or wet bathing suits for long periods of time.  Participating in sports.  Being overweight.  Having diabetes. What are the signs or symptoms? Symptoms of jock itch may include:  A red, pink, or brown rash in the groin area. The rash may spread to the thighs, anus, and buttocks.  Dry and scaly skin on or around the rash.  Itchiness. How is this diagnosed? Most often, a health care provider can make the diagnosis by looking at your rash. Sometimes, a scraping of the infected skin will be taken. This sample may be tested by looking at it under a microscope or by trying to grow the fungus from the sample (culture). How is this treated? Treatment for this condition may include:  Antifungal medicine to kill the fungus.  This may be in various forms:  Skin cream or ointment.  Medicine taken by mouth.  Skin cream or ointment to reduce the itching.  Compresses or medicated powders to dry the infected skin. Follow these instructions at home:  Take medicines only as directed by your health care provider. Apply skin creams or ointments exactly as directed.  Wear loose-fitting clothing.  Men should wear cotton boxer shorts.  Women should wear cotton underwear.  Change your underwear every day to keep your groin dry.  Avoid hot baths.  Dry your groin area well after bathing.  Use a separate towel to dry your groin area. This will help to prevent a spreading of the infection to other areas of your body.  Do not scratch the affected area.  Do not share towels with other people. Contact a health care provider if:  Your rash does not improve or it gets worse after 2 weeks of treatment.  Your rash is spreading.  Your rash returns after treatment is  finished.  You have a fever.  You have redness, swelling, or pain in the area around your rash.  You have fluid, blood, or pus coming from your rash.  Your have your rash for more than 4 weeks. This information is not intended to replace advice given to you by your health care provider. Make sure you discuss any questions you have with your health care provider. Document Released: 09/24/2002 Document Revised: 03/11/2016 Document Reviewed: 07/16/2014 Elsevier Interactive Patient Education  2017 [employer].

## 2017-02-28 NOTE — Progress Notes (Signed)
ACUTE CARE OV today:    Impression and Recommendations:    1. Tick bite, initial encounter   2. Tinea cruris   3. Acute reaction to stress/ Anxiety   4. Multiple benign nevi without atypia- specifically 4*27mm R upper outer thigh    If you decide to take ABX- please call us and let us know.  If you dev fevers, chills, muscles aches, jt aches, headaches/ feel ill---> let us know.    We were NOT able to add on labs to your existing ones drawn earlier today from Cardiology.  Asked Kenney Houseman to call pt to tell him to come in for additional bldwrk and labs today   1) - pt refused doxy txmnt today after answering all his Q's about it and discussing possible s-e and sun sensitivities with him.  He is "an outdoorsy guy" and going camping and declines meds- understands my recs for early med intervention  -  I discussed low yield results of testing bldwrk this early on after tick bite- still refused ABX meds even after my discussion about CDC recs.  - long term sequela of possible lyme's touched upon with pt.; he will contact us if he dev any new sx.  2) ---> told pt spot on glans appears to be old scar, pt insisted it is a new lesion.  Rec-  OTC tinea cruris cream * several wks.   Keep dry.  May use tineal powders prns.   3) Pt somewhat anxious today in office visit and resistant to txmnt plan, will cont to monitor  4) cont to monitor yrly during CPE; declined derm referral   The patient was counseled, risk factors were discussed, anticipatory guidance given.   Orders Placed This Encounter  Procedures  . B. burgdorfi antibodies  . Rocky mtn spotted fvr abs pnl(IgG+IgM)     Gross side effects, risk and benefits, and alternatives of medications and treatment plan in general discussed with patient.  Patient is aware that all medications have potential side effects and we are unable to predict every side effect or drug-drug interaction that may occur.   Patient will call with any questions  prior to using medication if they have concerns.  Expresses verbal understanding and consents to current therapy and treatment regimen.  No barriers to understanding were identified.  Red flag symptoms and signs discussed in detail.  Patient expressed understanding regarding what to do in case of emergency\urgent symptoms  Please see AVS handed out to patient at the end of our visit for further patient instructions/ counseling done pertaining to today's office visit.   Return if symptoms worsen or fail to improve, for Follow-up of current medical issues q 46mo.     Note: This document was prepared using Dragon voice recognition software and may include unintentional dictation errors.  Mellody Dance 10:54 PM --------------------------------------------------------------------------------------------------------------------------------------------------------------------------------------------------------------------------------------------    Subjective:    CC:  Chief Complaint  Patient presents with  . Tick Removal    HPI: Anthony Garcia is a 41 y.o. male who presents to [place] at [facility] today for issues as discussed below.   Found 2 ticks on R teste- was there about 36-48hrs.  Was unable to remove head of one of them. Tick bites on the 29th--> about 2 wks ago.    Asx, no f/c, ha, myalgais/arthrlagias.  Brought in tick- appeared to be c/w "lone star tick".   Said that he noticed a new "rash" on end of penis- never noticed  prior.    Also c/o L upper outer thigh with "mole" - been there "forever", no changes - wondering if worrisome.   No bleeding, no change size/ color etc.  Never goes for yrly skin screenings/ CPE    Wt Readings from Last 3 Encounters:  02/28/17 225 lb (102.1 kg)  01/04/17 231 lb (104.8 kg)  12/16/16 236 lb 11.2 oz (107.4 kg)   BP Readings from Last 3 Encounters:  02/28/17 118/71  01/04/17 121/80  12/16/16 137/84   Pulse Readings  from Last 3 Encounters:  02/28/17 61  01/04/17 85  12/16/16 82   BMI Readings from Last 3 Encounters:  02/28/17 28.89 kg/m  01/04/17 29.66 kg/m  12/16/16 31.02 kg/m     Patient Care Team    Relationship Specialty Notifications Start End  Mellody Dance, DO PCP - General Family Medicine  11/11/16      Patient Active Problem List   Diagnosis Date Noted  . Hyperlipidemia 11/08/2016    Priority: High  . Hypertension 11/08/2016    Priority: High  . Acute reaction to stress/ Anxiety 11/08/2016    Priority: High  . medical Noncompliance 11/08/2016    Priority: High  . Snores 11/08/2016    Priority: Medium  . Family history of early CAD 11/08/2016    Priority: Low  . Multiple benign nevi without atypia- specifically 4*73mm R upper outer thigh 02/28/2017  . History of episode of anxiety 12/16/2016  . H/O knee surgery- multiple L knee sx 11/08/2016  . Breath holding episodes 11/08/2016  . Tobacco use disorder 11/08/2016  . Tobacco abuse counseling 11/08/2016  . Palpitations 11/08/2016  . Obesity, Class I, BMI 30-34.9 11/08/2016  . Environmental and seasonal allergies 11/08/2016    Past Medical history, Surgical history, Family history, Social history, Allergies and Medications have been entered into the medical record, reviewed and changed as needed.    Current Meds  Medication Sig  . atorvastatin (LIPITOR) 40 MG tablet Take 1 tablet (40 mg total) by mouth daily.  Marland Kitchen losartan-hydrochlorothiazide (HYZAAR) 50-12.5 MG tablet Take 1 tablet by mouth daily.  . metoprolol (LOPRESSOR) 50 MG tablet Take 1/2 to 1 tablet as needed for palpitations.    Allergies:  No Known Allergies   Review of Systems: General:   Denies fever, chills, unexplained weight loss.  Optho/Auditory:   Denies visual changes, blurred vision/LOV Respiratory:   Denies wheeze, DOE more than baseline levels.  Cardiovascular:   Denies chest pain, palpitations, new onset peripheral edema    Gastrointestinal:   Denies nausea, vomiting, diarrhea, abd pain.  Genitourinary: Denies dysuria, freq/ urgency, flank pain or discharge from genitals.  Endocrine:     Denies hot or cold intolerance, polyuria, polydipsia. Musculoskeletal:   Denies unexplained myalgias, joint swelling, unexplained arthralgias, gait problems.  Skin:  + new onset rash, + suspicious lesions Neurological:     Denies dizziness, unexplained weakness, numbness  Psychiatric/Behavioral:   Denies mood changes, suicidal or homicidal ideations, hallucinations    Objective:   Blood pressure 118/71, pulse 61, temperature 98.2 F (36.8 C), temperature source Oral, height 6\' 2"  (1.88 m), weight 225 lb (102.1 kg). Body mass index is 28.89 kg/m. General:  Well Developed, well nourished, appropriate for stated age.  Neuro:  Alert and oriented,  extra-ocular muscles intact  HEENT:  Normocephalic, atraumatic, neck supple, no carotid bruits appreciated  Skin:  no gross rash, warm, pink.  Hyperpigmented spherical skin lesion 4 * 5 mm R outer thigh;  L side of glans  with what appears to be old scar from circumcision, 68mm flesh colored circumferential skin fold base of glans, no erythema, no bullseye no rash apprec Cardiac:  RRR, S1 S2 Respiratory:  ECTA B/L and A/P, Not using accessory muscles, speaking in full sentences- unlabored. Vascular:  Ext warm, no cyanosis apprec.; cap RF less 2 sec. Psych:  No HI/SI, judgement and insight good, Euthymic mood. Full Affect.

## 2017-03-01 ENCOUNTER — Encounter: Payer: Self-pay | Admitting: *Deleted

## 2017-03-15 ENCOUNTER — Telehealth: Payer: Self-pay | Admitting: Family Medicine

## 2017-03-15 NOTE — Telephone Encounter (Signed)
Pt states that his rash continues as well as joint aches and fatigue.  Pt denies fever or chills.  Pt requests RX for doxycycline to be sent in now since he continues to have symptoms.  Charyl Bigger, CMA

## 2017-03-15 NOTE — Telephone Encounter (Signed)
Pt called to see if results of tick bite test results have been received --- patient request call back with information. --glh

## 2017-03-16 NOTE — Telephone Encounter (Signed)
Since the tick bites were over a month ago I recommend we obtain blood work and test for Lyme's disease.  If positive, then we can tx.

## 2017-03-16 NOTE — Telephone Encounter (Signed)
I apologize but please strike-out  or OMIT  my earlier comment from 4:47 PM.  Please inform pt that upon further investigation and discussion with one of our ID docs here at [facility], [place] Dx is uncommon here, and there is no need to treat or test for this with the symptoms patient is having.    Especially since the patient is not having a rash consistent with lymes, or Rocky Mtn spotted fever or Ehrlichia, and also he is Afebrile, there is no need for further treatment at this time.  If he develops additional symptoms, then he will need to be reevaluated.  Thanks,   DrO

## 2017-03-17 NOTE — Telephone Encounter (Signed)
Spoke with patient regarding his tick bite and concerns.  Offered patient appointment to disscuss issues and concerns.  Patient declined.  Advised him there is no recommendations at this point with out being seen.  Patient then hung up the phone.

## 2017-04-08 ENCOUNTER — Encounter: Payer: Self-pay | Admitting: Cardiovascular Disease

## 2017-04-08 ENCOUNTER — Ambulatory Visit (INDEPENDENT_AMBULATORY_CARE_PROVIDER_SITE_OTHER): Payer: BLUE CROSS/BLUE SHIELD | Admitting: Cardiovascular Disease

## 2017-04-08 VITALS — BP 127/76 | HR 71 | Ht 74.0 in | Wt 222.4 lb

## 2017-04-08 DIAGNOSIS — G478 Other sleep disorders: Secondary | ICD-10-CM

## 2017-04-08 DIAGNOSIS — I10 Essential (primary) hypertension: Secondary | ICD-10-CM | POA: Diagnosis not present

## 2017-04-08 DIAGNOSIS — R002 Palpitations: Secondary | ICD-10-CM

## 2017-04-08 DIAGNOSIS — E782 Mixed hyperlipidemia: Secondary | ICD-10-CM

## 2017-04-08 DIAGNOSIS — Z87891 Personal history of nicotine dependence: Secondary | ICD-10-CM

## 2017-04-08 MED ORDER — LOSARTAN POTASSIUM-HCTZ 50-12.5 MG PO TABS: 1.0000 | ORAL_TABLET | Freq: Every day | ORAL | 3 refills | Status: DC

## 2017-04-08 MED ORDER — ATORVASTATIN CALCIUM 40 MG PO TABS: 40.0000 mg | ORAL_TABLET | Freq: Every day | ORAL | 3 refills | Status: DC

## 2017-04-08 NOTE — Patient Instructions (Signed)
Medication Instructions:   Refill sent to the pharmacy electronically.   Labwork:  Your physician recommends that you return for lab work [place]:  Your physician wants you to follow-up in: [place] will receive a reminder letter in the mail two months in advance. If you don't receive a letter, please call our office to schedule the follow-up appointment.   If you need a refill on your cardiac medications before your next appointment, please call your pharmacy.

## 2017-04-08 NOTE — Progress Notes (Signed)
Cardiology Office Note    Date:  04/09/2017   ID:  Anthony Garcia, DOB 04-28-1976, MRN [id number]  PCP:  Patient, No Pcp Per  Cardiologist:  Anthony Majestic, MD   No chief complaint on file.   History of Present Illness:  Anthony Garcia is a 41 y.o. male is referred for cardiology consultation through the request of Dr. Everette Garcia for evaluation of palpitations, hyperlipidemia, and difficulty with sleep.  Mr. Born was born in [place].  His father was in [employer] as result, the patient lived in numerous places growing up, but he regards [place] as the place that he called home.  His father, who was very active, died suddenly from a heart attack at age 29.  He had a history of hyperlipidemia.  The patient states that over the past 20 years.  he has smoked off and on and for the past 21 days has been on Chantix and has not been smoking.  Prior to  moving to [place]  he was living in [place].  While in [place], he had some symptoms of vague chest pain or palpitations and apparently saw a cardiologist on one occasion and had an exercise stress echo.  He was placed on medication for hypertension losartan HCT, and also was started on atorvastatin.  When his prescriptions ran out, he had stopped taking the medication.  For the past year, before moving to [place] he had lived in [place] , but did not seek medical attention.  Recently, he has noticed episodes of palpitations.  He denies that he is under significant increased stress.  He has 3 small children.  Because of palpitations, he was seen by Dr. Ralph Garcia.  The patient states he was told that it was most likely due to anxiety.  He tells me she had wanted to prescribe Zoloft but he refused.  Later that night, he had recurrent symptoms and presented for emergency room evaluation.  No ectopy was detected.  He also had issues with lower extremity swelling.  A venous lower extremity ultrasound was negative for deep vein thrombosis.  Patient had been  started back on losartan HCT 50/12.5 as well as atorvastatin 40 mg.  Baseline laboratory revealed a total cholesterol of 306, triglycerides 210, HDL 38, VLDL 42, and LDL cholesterol at 226.  The patient states over the past several weeks he has dramatically changed his diet, eating very poorly previously.  His palpitations have improved.  He denies significant caffeine use.  However, he still notes an occasional episode where his heart beats fast.  He also has been told that he stops breathing when he is lying on his back asleep.  He was told of snoring loudly.  Recently he has noticed nocturia at least 2-3 times per night.  He admits that he has not been very active.  He had sustained 5 left knee surgeries.  I saw him for initial evaluation on 01/04/2017.  Since that time, he has been on atorvastatin 40 mg.  At that time, I had a long discussion with him concerning recent literature with reference to subclinical atherosclerosis in the importance of aggressive treatment.  His ECG was stable.  I gave him a prescription for metoprolol, tartrate, and he has taken 25 mg on 2 occasions.  His palpitations are better.  He has lost weight.  He has stopped smoking.  He was denied an inpatient sleep test and is tentatively scheduled for home study.  He believes he is sleeping better with  weight loss and stopping smoking.  He tells me he is planning to go out of town when this home study is scheduled and this will need to be deferred.  He denies any chest tightness.  He presents for a three-month follow-up evaluation.  Past Medical History:  Diagnosis Date  . Hyperlipidemia   . Hypertension     Past Surgical History:  Procedure Laterality Date  . ANTERIOR CRUCIATE LIGAMENT REPAIR Left   . KNEE ARTHROSCOPY Left     Current Medications: Outpatient Medications Prior to Visit  Medication Sig Dispense Refill  . metoprolol (LOPRESSOR) 50 MG tablet Take 1/2 to 1 tablet as needed for palpitations. 30 tablet 3  .  Vitamin D, Ergocalciferol, (DRISDOL) 50000 units CAPS capsule Take 1 capsule (50,000 Units total) by mouth every 7 (seven) days. 8 capsule 2  . atorvastatin (LIPITOR) 40 MG tablet Take 1 tablet (40 mg total) by mouth daily. 90 tablet 0  . losartan-hydrochlorothiazide (HYZAAR) 50-12.5 MG tablet Take 1 tablet by mouth daily. 90 tablet 1   No facility-administered medications prior to visit.      Allergies:   Patient has no known allergies.   Social History   Social History  . Marital status: Married    Spouse name: Anthony Garcia  . Number of children: Anthony Garcia  . Years of education: Anthony Garcia   Social History Main Topics  . Smoking status: Former Smoker    Packs/day: 0.50    Years: 15.00  . Smokeless tobacco: Never Used  . Alcohol use No  . Drug use: No  . Sexual activity: Yes   Other Topics Concern  . None   Social History Narrative  . None    Additional social history is notable in that he has been smoking for almost 20 years.  He has quit smoking He has 3 children, ages 18, 35, and 19.  He is married for 10 years.  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  Family History:  The patient's family history includes Healthy in his son, son, and son; Heart attack in his father; Hyperlipidemia in his brother and father; Hypertension in his brother and father.  According to the patient, the father was very active and healthy and died suddenly with an MI at age 43.  His father had a history of significant hyperlipidemia.  ROS General: Negative; No fevers, chills, or night sweats;  HEENT: Negative; No changes in vision or hearing, sinus congestion, difficulty swallowing Pulmonary: Negative; No cough, wheezing, shortness of breath, hemoptysis Cardiovascular: Positive for palpitations;  No chest pain, presyncope, syncope GI: Negative; No nausea, vomiting, diarrhea, or abdominal pain GU: Negative; No dysuria, hematuria, or difficulty voiding Musculoskeletal: Negative; no myalgias, joint pain, or  weakness; history of 5 knee surgeries Hematologic/Oncology: Negative; no easy bruising, bleeding Endocrine: Negative; no heat/cold intolerance; no diabetes Neuro: Negative; no changes in balance, headaches Skin: Negative; No rashes or skin lesions Psychiatric: Negative; No behavioral problems, depression Sleep: Positive for loud snoring, witnessed apnea when lying on his back, 2-3 time per night nocturia, no daytime sleepiness, hypersomnolence, bruxism, restless legs, hypnogognic hallucinations, no cataplexy Other comprehensive 14 point system review is negative.   PHYSICAL EXAM:   VS:  BP 127/76 (BP Location: Right Arm)   Pulse 71   Ht 6' 2"  (1.88 m)   Wt 222 lb 6.4 oz (100.9 kg)   BMI 28.55 kg/m     Repeat blood pressure by me 11878  Wt Readings from Last 3 Encounters:  04/08/17 222 lb 6.4 oz (100.9 kg)  02/28/17 225 lb (102.1 kg)  01/04/17 231 lb (104.8 kg)    General: Alert, oriented, no distress.  Skin: normal turgor, no rashes, warm and dry HEENT: Normocephalic, atraumatic. Bilateral arcus senilis/arcus cornealis.  Positive xanthelasma over his left eye.  Pupils equal round and reactive to light; sclera anicteric; extraocular muscles intact; when his fundi was checked at his initial visit there were no  No [place] plaque seen.  Disc flat. Nose without nasal septal hypertrophy Mouth/Parynx benign; Mallinpatti scale 3 Neck: No JVD, no carotid bruits; normal carotid upstroke Lungs: clear to ausculatation and percussion; no wheezing or rales Chest wall: without tenderness to palpitation Heart: PMI not displaced, RRR, s1 s2 normal, 1/6 systolic murmur, no diastolic murmur, no rubs, gallops, thrills, or heaves Abdomen: soft, nontender; no hepatosplenomehaly, BS+; abdominal aorta nontender and not dilated by palpation. Back: no CVA tenderness Pulses 2+ Musculoskeletal: full range of motion, normal strength, no joint deformities Extremities: no clubbing cyanosis or edema,  Homan's sign negative  Neurologic: grossly nonfocal; Cranial nerves grossly wnl Psychologic: Normal mood and affect   Studies/Labs Reviewed:   EKG:  EKG is ordered today.  ECG (independently read by me): Normal sinus rhythm at 71 bpm.  Normal intervals.  No ST segment changes.  March 2018 ECG (independently read by me): Normal sinus rhythm at 70 bpm.  Normal intervals.  No ST segment changes.  Recent Labs: BMP Latest Ref Rng & Units 02/28/2017 11/11/2016 11/09/2016  Glucose 65 - 99 mg/dL 102(H) 96 104(H)  BUN 7 - 25 mg/dL 16 17 23(H)  Creatinine 0.60 - 1.35 mg/dL 1.16 1.13 1.03  BUN/Creat Ratio 9 - 20 - 15 -  Sodium 135 - 146 mmol/L 139 142 133(L)  Potassium 3.5 - 5.3 mmol/L 4.2 4.3 3.3(L)  Chloride 98 - 110 mmol/L 103 104 100(L)  CO2 20 - 31 mmol/L 25 25 24   Calcium 8.6 - 10.3 mg/dL 9.0 9.2 8.8(L)     Hepatic Function Latest Ref Rng & Units 02/28/2017 11/11/2016 11/18/2014  Total Protein 6.1 - 8.1 g/dL 6.7 7.0 -  Albumin 3.6 - 5.1 g/dL 4.4 4.3 -  AST 10 - 40 U/L 14 17 22   ALT 9 - 46 U/L 19 24 39  Alk Phosphatase 40 - 115 U/L 29(L) 29(L) 34  Total Bilirubin 0.2 - 1.2 mg/dL 1.0 1.2 -    CBC Latest Ref Rng & Units 11/11/2016 11/09/2016  WBC 3.4 - 10.8 x10E3/uL 7.5 12.0(H)  Hemoglobin 13.0 - 17.7 g/dL 16.0 16.0  Hematocrit 37.5 - 51.0 % 46.2 46.3  Platelets 150 - 379 x10E3/uL 237 238   Lab Results  Component Value Date   MCV 87 11/11/2016   MCV 85.6 11/09/2016   Lab Results  Component Value Date   TSH 0.606 11/11/2016   Lab Results  Component Value Date   HGBA1C 5.9 (H) 11/11/2016     BNP No results found for: BNP  ProBNP No results found for: PROBNP   Lipid Panel     Component Value Date/Time   CHOL 107 02/28/2017 0848   CHOL 306 (H) 12/16/2016 0845   TRIG 74 02/28/2017 0848   HDL 32 (L) 02/28/2017 0848   HDL 38 (L) 12/16/2016 0845   CHOLHDL 3.3 02/28/2017 0848   VLDL 15 02/28/2017 0848   LDLCALC 60 02/28/2017 0848   LDLCALC 226 (H) 12/16/2016 0845      RADIOLOGY: No results found.   Additional studies/ records that were reviewed  today include:  At his initial evaluation I reviewed the office evaluation from Dr. Raliegh Scarlet, lower extremity venous Doppler study, laboratory, and ER evaluation.    ASSESSMENT:    1. Mixed hyperlipidemia   2. Essential hypertension   3. Palpitations   4. Non-restorative sleep   5. History of tobacco abuse      PLAN:  Mr. Parlee is a 42 year old Caucasian male who has a significant cardiac risk factor profile notable for a 20 year history of tobacco use, premature family history for CAD with his father dying suddenly in his 43s, and he was recently found to have marked elevation of his lipid studies.  On physical examination, he has arcus cornealis, as well as xanthelasmas.  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  He does not have any tendon xanthomas.  His brother also has significant hyperlipidemic history.  He has significantly changed his diet.  I reviewed with him his recent laboratory which showed marked improvement with reduction of his LDL cholesterol from 226 to 60.   He had experienced episodes of palpitations, but these seem to have improved.  He still notes a rare occurrence.  On 2 occasions he has taken metoprolol tartrate 25 mg.  He does not appear to need daily, beta blocker therapy.  His ECG today shows sinus rhythm without ectopy.  No ectopy was noted during his prior evaluations.  He has a history of hypertension and presently is on losartan HCT 50/12.5.  His blood pressure today continues to be excellent and on repeat by me was 118/78.   When I initially saw him I had a very long discussion with him regarding his sleep history.  His symptoms were highly suggestive of obstructive sleep apnea.  I discussed with him at length the cardiovascular risk associated with untreated sleep apnea particularly with reference to blood pressure  development, nocturnal arrhythmias, inflammation, potential increased glucose intolerance, and potential for significant oxygen desaturation/MI/TIA/stroke.  I scheduled him for an sleep study, but this was denied by his insurance.  He is tentatively scheduled to undergo a home study on July 3 but he is planning to be out of town.  He believes he is sleeping better.  His wife states he no longer is snoring.  I have suggested that he defer the sleep study for the scheduled date, but still plan to have this done at a future date when he is back in town.  We discussed supplements concerning coenzyme Q 10.  He also discussed his brother in detail.  He is exercising and swimming several days per week and does some resistance training at home.  He will monitor his blood pressure and palpitations.  I will repeat laboratory in 6 months and see him for follow-up evaluation.  Medication Adjustments/Labs and Tests Ordered: Current medicines are reviewed at length with the patient today.  Concerns regarding medicines are outlined above.  Medication changes, Labs and Tests ordered today are listed in the Patient Instructions below. Patient Instructions  Medication Instructions:   Refill sent to the pharmacy electronically.   Labwork:  Your physician recommends that you return for lab work [place]:  Your physician wants you to follow-up in: [place] will receive a reminder letter in the mail two months in advance. If you don't receive a letter, please call our office to schedule the follow-up appointment.   If you need a refill  on your cardiac medications before your next appointment, please call your pharmacy.       Signed, Anthony Majestic, MD  04/09/2017 9:42 AM    [facility] [address], [place], [place], [place]  [address] Phone: [phone]

## 2017-04-19 ENCOUNTER — Encounter (HOSPITAL_BASED_OUTPATIENT_CLINIC_OR_DEPARTMENT_OTHER): Payer: BLUE CROSS/BLUE SHIELD

## 2017-08-31 ENCOUNTER — Other Ambulatory Visit: Payer: Self-pay | Admitting: Physician Assistant

## 2017-08-31 DIAGNOSIS — H9120 Sudden idiopathic hearing loss, unspecified ear: Secondary | ICD-10-CM

## 2017-08-31 DIAGNOSIS — H9312 Tinnitus, left ear: Secondary | ICD-10-CM

## 2017-09-11 ENCOUNTER — Ambulatory Visit
Admission: RE | Admit: 2017-09-11 | Discharge: 2017-09-11 | Disposition: A | Discharge status: Home / Self Care | Payer: BLUE CROSS/BLUE SHIELD | Source: Ambulatory Visit | Attending: Physician Assistant | Admitting: Physician Assistant

## 2017-09-11 DIAGNOSIS — H9312 Tinnitus, left ear: Secondary | ICD-10-CM

## 2017-09-11 DIAGNOSIS — H9120 Sudden idiopathic hearing loss, unspecified ear: Secondary | ICD-10-CM

## 2017-09-11 MED ORDER — GADOBENATE DIMEGLUMINE 529 MG/ML IV SOLN
19.0000 mL | Freq: Once | INTRAVENOUS | Status: AC | PRN
  Administered 2017-09-11: 19 mL via INTRAVENOUS

## 2017-09-20 DIAGNOSIS — G43001 Migraine without aura, not intractable, with status migrainosus: Secondary | ICD-10-CM | POA: Diagnosis not present

## 2017-09-20 DIAGNOSIS — G43009 Migraine without aura, not intractable, without status migrainosus: Secondary | ICD-10-CM | POA: Diagnosis not present

## 2017-09-20 DIAGNOSIS — H90A22 Sensorineural hearing loss, unilateral, left ear, with restricted hearing on the contralateral side: Secondary | ICD-10-CM | POA: Diagnosis not present

## 2017-09-20 DIAGNOSIS — Z87891 Personal history of nicotine dependence: Secondary | ICD-10-CM | POA: Diagnosis not present

## 2017-09-20 DIAGNOSIS — H832X2 Labyrinthine dysfunction, left ear: Secondary | ICD-10-CM | POA: Diagnosis not present

## 2017-09-20 DIAGNOSIS — I10 Essential (primary) hypertension: Secondary | ICD-10-CM | POA: Diagnosis not present

## 2017-10-03 DIAGNOSIS — H832X2 Labyrinthine dysfunction, left ear: Secondary | ICD-10-CM | POA: Diagnosis not present

## 2017-10-03 DIAGNOSIS — R2689 Other abnormalities of gait and mobility: Secondary | ICD-10-CM | POA: Diagnosis not present

## 2017-10-03 DIAGNOSIS — R42 Dizziness and giddiness: Secondary | ICD-10-CM | POA: Diagnosis not present

## 2017-10-10 DIAGNOSIS — M545 Low back pain: Secondary | ICD-10-CM | POA: Diagnosis not present

## 2017-10-10 DIAGNOSIS — M542 Cervicalgia: Secondary | ICD-10-CM | POA: Diagnosis not present

## 2017-10-10 DIAGNOSIS — G43001 Migraine without aura, not intractable, with status migrainosus: Secondary | ICD-10-CM | POA: Diagnosis not present

## 2017-10-10 DIAGNOSIS — R42 Dizziness and giddiness: Secondary | ICD-10-CM | POA: Diagnosis not present

## 2017-12-05 DIAGNOSIS — L2089 Other atopic dermatitis: Secondary | ICD-10-CM | POA: Diagnosis not present

## 2018-01-10 DIAGNOSIS — R5383 Other fatigue: Secondary | ICD-10-CM | POA: Diagnosis not present

## 2018-01-17 ENCOUNTER — Encounter: Payer: Self-pay | Admitting: Allergy and Immunology

## 2018-01-17 ENCOUNTER — Ambulatory Visit: Payer: BLUE CROSS/BLUE SHIELD | Admitting: Allergy and Immunology

## 2018-01-17 VITALS — BP 140/80 | HR 62 | Temp 97.9°F | Resp 16 | Ht 72.6 in | Wt 193.0 lb

## 2018-01-17 DIAGNOSIS — J3089 Other allergic rhinitis: Secondary | ICD-10-CM

## 2018-01-17 DIAGNOSIS — L500 Allergic urticaria: Secondary | ICD-10-CM

## 2018-01-17 DIAGNOSIS — H9192 Unspecified hearing loss, left ear: Secondary | ICD-10-CM

## 2018-01-17 MED ORDER — AZELASTINE HCL 0.1 % NA SOLN: NASAL | 5 refills | Status: DC

## 2018-01-17 MED ORDER — LEVOCETIRIZINE DIHYDROCHLORIDE 5 MG PO TABS: 5.0000 mg | ORAL_TABLET | Freq: Every evening | ORAL | 5 refills | Status: DC

## 2018-01-17 NOTE — Patient Instructions (Addendum)
Perennial allergic rhinitis  Aeroallergen avoidance measures have been discussed and provided in written form.  A prescription has been provided for levocetirizine, 5 mg daily as needed.  A prescription has been provided for azelastine nasal spray, 1-2 sprays per nostril 2 times daily as needed. Proper nasal spray technique has been discussed and demonstrated.   Nasal saline spray (i.e., Simply Saline) or nasal saline lavage (i.e., NeilMed) is recommended as needed and prior to medicated nasal sprays.  Rash Unclear etiology.  This may have represented a viral exanthem.  Food allergen skin tests negative with the exception of borderline positive results to shellfish, however the patient states that he consumes shellfish on a regular basis without symptoms and shellfish consumption was not associated with the onset of the rash 1 month ago.   Should symptoms recur, a journal is to be kept recording any foods eaten, beverages consumed, and medications taken within a 6 hour time period prior to the onset of symptoms, as well as record activities being performed, and environmental conditions. For any symptoms concerning for anaphylaxis, 911 is to be called immediately.    Control of House Dust Mite Allergen  House dust mites play a major role in allergic asthma and rhinitis.  They occur in environments with high humidity wherever human skin, the food for dust mites is found. High levels have been detected in dust obtained from mattresses, pillows, carpets, upholstered furniture, bed covers, clothes and soft toys.  The principal allergen of the house dust mite is found in its feces.  A gram of dust may contain 1,000 mites and 250,000 fecal particles.  Mite antigen is easily measured in the air during house cleaning activities.    1. Encase mattresses, including the box spring, and pillow, in an air tight cover.  Seal the zipper end of the encased mattresses with wide adhesive tape. 2. Wash the  bedding in water of 130 degrees Farenheit weekly.  Avoid cotton comforters/quilts and flannel bedding: the most ideal bed covering is the dacron comforter. 3. Remove all upholstered furniture from the bedroom. 4. Remove carpets, carpet padding, rugs, and non-washable window drapes from the bedroom.  Wash drapes weekly or use plastic window coverings. 5. Remove all non-washable stuffed toys from the bedroom.  Wash stuffed toys weekly. 6. Have the room cleaned frequently with a vacuum cleaner and a damp dust-mop.  The patient should not be in a room which is being cleaned and should wait 1 hour after cleaning before going into the room. 7. Close and seal all heating outlets in the bedroom.  Otherwise, the room will become filled with dust-laden air.  An electric heater can be used to heat the room. 8. Reduce indoor humidity to less than 50%.  Do not use a humidifier.  Control of Mold Allergen  Mold and fungi can grow on a variety of surfaces provided certain temperature and moisture conditions exist.  Outdoor molds grow on plants, decaying vegetation and soil.  The major outdoor mold, Alternaria and Cladosporium, are found in very high numbers during hot and dry conditions.  Generally, a late Summer - Fall peak is seen for common outdoor fungal spores.  Rain will temporarily lower outdoor mold spore count, but counts rise rapidly when the rainy period ends.  The most important indoor molds are Aspergillus and Penicillium.  Dark, humid and poorly ventilated basements are ideal sites for mold growth.  The next most common sites of mold growth are the bathroom and the kitchen.  Outdoor [employer] 1. Use air conditioning and keep windows closed 2. Avoid exposure to decaying vegetation. 3. Avoid leaf raking. 4. Avoid grain handling. 5. Consider wearing a face mask if working in moldy areas.  Indoor Mold Control 1. Maintain humidity below 50%. 2. Clean washable surfaces with 5% bleach  solution. 3. Remove sources e.g. Contaminated carpets.  Control of Dog or Cat Allergen  Avoidance is the best way to manage a dog or cat allergy. If you have a dog or cat and are allergic to dog or cats, consider removing the dog or cat from the home. If you have a dog or cat but don't want to find it a new home, or if your family wants a pet even though someone in the household is allergic, here are some strategies that may help keep symptoms at bay:  1. Keep the pet out of your bedroom and restrict it to only a few rooms. Be advised that keeping the dog or cat in only one room will not limit the allergens to that room. 2. Don't pet, hug or kiss the dog or cat; if you do, wash your hands with soap and water. 3. High-efficiency particulate air (HEPA) cleaners run continuously in a bedroom or living room can reduce allergen levels over time. 4. Place electrostatic material sheet in the air inlet vent in the bedroom. 5. Regular use of a high-efficiency vacuum cleaner or a central vacuum can reduce allergen levels. 6. Giving your dog or cat a bath at least once a week can reduce airborne allergen.  Control of Cockroach Allergen  Cockroach allergen has been identified as an important cause of acute attacks of asthma, especially in urban settings.  There are fifty-five species of cockroach that exist in the [place], however only three, the [place], [place] species produce allergen that can affect patients with Asthma.  Allergens can be obtained from fecal particles, egg casings and secretions from cockroaches.    1. Remove food sources. 2. Reduce access to water. 3. Seal access and entry points. 4. Spray runways with 0.5-1% Diazinon or Chlorpyrifos 5. Blow boric acid power under stoves and refrigerator. 6. Place bait stations (hydramethylnon) at feeding sites.

## 2018-01-17 NOTE — Assessment & Plan Note (Signed)
Unclear etiology.  This may have represented a viral exanthem.  Food allergen skin tests negative with the exception of borderline positive results to shellfish, however the patient states that he consumes shellfish on a regular basis without symptoms and shellfish consumption was not associated with the onset of the rash 1 month ago.   Should symptoms recur, a journal is to be kept recording any foods eaten, beverages consumed, and medications taken within a 6 hour time period prior to the onset of symptoms, as well as record activities being performed, and environmental conditions. For any symptoms concerning for anaphylaxis, 911 is to be called immediately.

## 2018-01-17 NOTE — Assessment & Plan Note (Signed)
   Aeroallergen avoidance measures have been discussed and provided in written form.  A prescription has been provided for levocetirizine, 5 mg daily as needed.  A prescription has been provided for azelastine nasal spray, 1-2 sprays per nostril 2 times daily as needed. Proper nasal spray technique has been discussed and demonstrated.   Nasal saline spray (i.e., Simply Saline) or nasal saline lavage (i.e., NeilMed) is recommended as needed and prior to medicated nasal sprays.

## 2018-01-17 NOTE — Progress Notes (Signed)
New Patient Note  RE: Adnan Vanvoorhis MRN: [id number] DOB: 1976/01/11 Date of Office Visit: 01/17/2018  Referring provider: Ferd Hibbs, NP Primary care provider: Ferd Hibbs, NP  Chief Complaint: Allergic Rhinitis  and Rash   History of present illness: Anthony Garcia" Anthony Garcia is a 42 y.o. male seen today in consultation requested by Ferd Hibbs, NP.  He reports that approximately 4 weeks ago he developed a rash on his hands and his feet.  The rash is described as small red bumps which were nonpruritic but felt like "pinpricks".  He did not experience concomitant angioedema, cardiopulmonary symptoms, or GI symptoms.  The rash lasted for 10 days and then resolved completely without residual pigmentation or bruising.  No specific medication, food, skin care product, detergent, soap, or other environmental triggers have been identified. Rick experiences nasal congestion, postnasal drainage, and sinus pressure.  These symptoms occur year around but are most frequent and severe during the fall.  He has tried cetirizine and other over-the-counter antihistamines without significant relief. Liliane Channel also reports that approximately 4 months ago he heard a "pop" in his left ear and lost his ability to hear out of that ear.  He states that since that time he has also experienced tinnitus, migraines, and vertigo.  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  He has been evaluated by neurologists and otolaryngologists for this problem.  Assessment and plan: Perennial allergic rhinitis  Aeroallergen avoidance measures have been discussed and provided in written form.  A prescription has been provided for levocetirizine, 5 mg daily as needed.  A prescription has been provided for azelastine nasal spray, 1-2 sprays per nostril 2 times daily as needed. Proper nasal spray technique has been discussed  and demonstrated.   Nasal saline spray (i.e., Simply Saline) or nasal saline lavage (i.e., NeilMed) is recommended as needed and prior to medicated nasal sprays.  Rash Unclear etiology.  This may have represented a viral exanthem.  Food allergen skin tests negative with the exception of borderline positive results to shellfish, however the patient states that he consumes shellfish on a regular basis without symptoms and shellfish consumption was not associated with the onset of the rash 1 month ago.   Should symptoms recur, a journal is to be kept recording any foods eaten, beverages consumed, and medications taken within a 6 hour time period prior to the onset of symptoms, as well as record activities being performed, and environmental conditions. For any symptoms concerning for anaphylaxis, 911 is to be called immediately.   Meds ordered this encounter  Medications  . levocetirizine (XYZAL) 5 MG tablet    Sig: Take 1 tablet (5 mg total) by mouth every evening.    Dispense:  30 tablet    Refill:  5    Hold rx pt will call when needed  . azelastine (ASTELIN) 0.1 % nasal spray    Sig: 1-2 sprays per nostril 2 times daily    Dispense:  30 mL    Refill:  5    Hold for pt he will call when he needs it    Diagnostics: Epicutaneous testing: Positive to dust mite antigen. Intradermal testing: Positive to molds, cat hair, dog epithelia, and cockroach antigen. Food allergen skin testing: Borderline positive to shellfish mix, shrimp, crab, lobster, and scallops.  However, he states that he consumes these foods on a regular basis without symptoms.  Physical examination: Blood pressure 140/80, pulse 62, temperature 97.9 F (36.6 C), temperature source Oral, resp. rate 16, height 6' 0.6" (1.844 m), weight 193 lb (87.5 kg), SpO2 98 %.  General: Alert, interactive, in no acute distress. HEENT: TMs pearly gray, turbinates moderately edematous with clear discharge, post-pharynx mildly  erythematous. Neck: Supple without lymphadenopathy. Lungs: Clear to auscultation without wheezing, rhonchi or rales. CV: Normal S1, S2 without murmurs. Abdomen: Nondistended, nontender. Skin: Warm and dry, without lesions or rashes. Extremities:  No clubbing, cyanosis or edema. Neuro:   Grossly intact.  Review of systems:  Review of systems negative except as noted in HPI / PMHx or noted below: Review of Systems  Constitutional: Negative.   HENT: Negative.   Eyes: Negative.   Respiratory: Negative.   Cardiovascular: Negative.   Gastrointestinal: Negative.   Genitourinary: Negative.   Musculoskeletal: Negative.   Skin: Negative.   Neurological: Negative.   Endo/Heme/Allergies: Negative.   Psychiatric/Behavioral: Negative.     Past medical history:  Past Medical History:  Diagnosis Date  . Hyperlipidemia   . Hypertension     Past surgical history:  Past Surgical History:  Procedure Laterality Date  . ANTERIOR CRUCIATE LIGAMENT REPAIR Left   . KNEE ARTHROSCOPY Left     Family history: Family History  Problem Relation Age of Onset  . Heart attack Father   . Hyperlipidemia Father   . Hypertension Father   . Hyperlipidemia Brother   . Hypertension Brother   . Healthy Son   . Healthy Son   . Healthy Son   . Allergic rhinitis Neg Hx   . Angioedema Neg Hx   . Asthma Neg Hx   . Eczema Neg Hx   . Immunodeficiency Neg Hx   . Urticaria Neg Hx     Social history: Social History   Socioeconomic History  . Marital status: Married    Spouse name: Not on file  . Number of children: Not on file  . Years of education: Not on file  . Highest education level: Not on file  Occupational History  . Not on file  Social Needs  . Financial resource strain: Not on file  . Food insecurity:    Worry: Not on file    Inability: Not on file  . Transportation needs:    Medical: Not on file    Non-medical: Not on file  Tobacco Use  . Smoking status: Former Smoker     Packs/day: 0.50    Years: 15.00    Pack years: 7.50    Types: Cigarettes  . Smokeless tobacco: Never Used  Substance and Sexual Activity  . Alcohol use: No  . Drug use: Yes    Types: Marijuana  . Sexual activity: Yes  Lifestyle  . Physical activity:    Days per week: Not on file    Minutes per session: Not on file  . Stress: Not on file  Relationships  . Social connections:    Talks on phone: Not on file    Gets together: Not on file    Attends religious service: Not on file    Active member of club or organization: Not on file    Attends meetings of clubs or organizations: Not on file    Relationship status: Not on file  . Intimate partner violence:    Fear of current or ex partner: Not on file    Emotionally abused: Not on file    Physically abused: Not on file  Forced sexual activity: Not on file  Other Topics Concern  . Not on file  Social History Narrative  . Not on file   Environmental History: The patient lives in a 41 year old house with hardwood floors throughout and central air/heat.  He is a non-smoker without pets.  There is no known mold/water damage in the home.  Allergies as of 01/17/2018   No Known Allergies     Medication List        Accurate as of 01/17/18  5:50 PM. Always use your most recent med list.          azelastine 0.1 % nasal spray Commonly known as:  ASTELIN 1-2 sprays per nostril 2 times daily   levocetirizine 5 MG tablet Commonly known as:  XYZAL Take 1 tablet (5 mg total) by mouth every evening.       Known medication allergies: No Known Allergies  I appreciate the opportunity to take part in Kanton's care. Please do not hesitate to contact me with questions.  Sincerely,   R. Edgar Frisk, MD

## 2018-01-23 ENCOUNTER — Telehealth: Payer: Self-pay

## 2018-01-23 NOTE — Telephone Encounter (Signed)
Contacted patient to verify if he was taking Losartan-hctz because t was not in his chart. Patient stated he was taking the medication in the past but is not at this current time. I offered to schedule patient aappointment for his 6 month follow up but he declined and stated he will contact the office when he needs [place].

## 2018-03-07 DIAGNOSIS — R300 Dysuria: Secondary | ICD-10-CM | POA: Diagnosis not present

## 2018-03-07 DIAGNOSIS — Z202 Contact with and (suspected) exposure to infections with a predominantly sexual mode of transmission: Secondary | ICD-10-CM | POA: Diagnosis not present

## 2018-09-06 DIAGNOSIS — M545 Low back pain: Secondary | ICD-10-CM | POA: Diagnosis not present

## 2018-09-06 DIAGNOSIS — M542 Cervicalgia: Secondary | ICD-10-CM | POA: Diagnosis not present

## 2018-09-06 DIAGNOSIS — M792 Neuralgia and neuritis, unspecified: Secondary | ICD-10-CM | POA: Diagnosis not present

## 2018-10-24 ENCOUNTER — Ambulatory Visit: Payer: BLUE CROSS/BLUE SHIELD | Admitting: Neurology

## 2018-10-24 ENCOUNTER — Encounter: Payer: Self-pay | Admitting: Neurology

## 2018-10-24 VITALS — BP 138/86 | HR 70 | Ht 72.5 in | Wt 186.0 lb

## 2018-10-24 DIAGNOSIS — R202 Paresthesia of skin: Secondary | ICD-10-CM

## 2018-10-24 DIAGNOSIS — M542 Cervicalgia: Secondary | ICD-10-CM

## 2018-10-24 NOTE — Progress Notes (Signed)
Reason for visit: Neck pain, paresthesias  Referring physician: Dr. Noralee Stain [facility] is a 43 y.o. male  History of present illness:  [name] is a 43 year old right-handed white male with a several year history of neck discomfort.  The patient does not report a specific injury to the neck, but he has had some soreness and stiffness of the neck off and on for many years.  Within the last year or so, the neck pain has been more persistent, he has trigger points in the neck with pain going down in the shoulders.  He has some discomfort around the medial epicondyle of the right elbow, he has developed some tingling in the feet and in the hands bilaterally that has come on within the last year.  He reports some weakness with grip.  The symptoms appear to be more significant on the left side of the body than the right.  The patient was noted to be prediabetic a year ago, but he lost 60 pounds of weight and this has since normalized the hemoglobin A1c.  In October 2018, the patient had a sudden onset of hearing loss in the left ear associated with a pop.  The patient had an increase in his headache frequency around that time as well.  He was seen and evaluated by ENT, no source of the hearing loss was noted.  The patient has had vertigo that came on with this, and a tendency to veer to the left.  MRI of the brain was done and was unremarkable.  The patient has had improvement in his headache frequency, he now gets a mild headache about once a month, he does not require medications for this.  The patient still has some gait instability, he has a tendency to veer to the left with walking.  The patient may have vertigo if he lies down flat and then extends his neck.  The patient had some problems with "brain fog" for several months after the onset of the hearing deficit, but this has since resolved.  The patient has been able to return to work.  The symptoms in the neck are better in the morning, worse  as the day goes on.  He is able to workout, he lifts weights.  He is sent to this office for further evaluation of the paresthesias in the neck and shoulder pain.  Past Medical History:  Diagnosis Date  . Hyperlipidemia   . Hypertension     Past Surgical History:  Procedure Laterality Date  . ANTERIOR CRUCIATE LIGAMENT REPAIR Left    x3   . KNEE ARTHROSCOPY Left     Family History  Problem Relation Age of Onset  . Anemia Mother   . Heart attack Father   . Hyperlipidemia Father   . Hypertension Father   . Hyperlipidemia Brother   . Hypertension Brother   . Atrial fibrillation Brother   . Healthy Son   . Healthy Son   . Healthy Son   . Allergic rhinitis Neg Hx   . Angioedema Neg Hx   . Asthma Neg Hx   . Eczema Neg Hx   . Immunodeficiency Neg Hx   . Urticaria Neg Hx     Social history:  reports that he has quit smoking. His smoking use included cigarettes. He has a 7.50 pack-year smoking history. He has never used smokeless tobacco. He reports current drug use. Drug: Marijuana. He reports that he does not drink alcohol.  Medications:  Prior  to Admission medications   Medication Sig Start Date End Date Taking? Authorizing Provider  GLUTATHIONE PO Take by mouth.   Yes [provider]  OVER THE COUNTER MEDICATION Resver   Yes [provider]  OVER THE COUNTER MEDICATION K-2 supplement   Yes [provider]  OVER THE COUNTER MEDICATION Electrolyte drink   Yes [provider]  TURMERIC PO Take by mouth.   Yes [provider]  VITAMIN D PO Take by mouth.   Yes [provider]     No Known Allergies  ROS:  Out of a complete 14 system review of symptoms, the patient complains only of the following symptoms, and all other reviewed systems are negative.  Fevers, chills, fatigue Palpitations of the heart Hearing loss in the left ear, ringing in the ears, dizziness, difficulty swallowing Feeling cold Joint pain, muscle  cramps, aching muscles Headache, numbness, weakness, dizziness  Blood pressure 138/86, pulse 70, height 6' 0.5" (1.842 m), weight 186 lb (84.4 kg), SpO2 98 %.  Physical Exam  General: The patient is alert and cooperative at the time of the examination.  Eyes: Pupils are equal, round, and reactive to light. Discs are flat bilaterally.  Neck: The neck is supple, no carotid bruits are noted.  Respiratory: The respiratory examination is clear.  Cardiovascular: The cardiovascular examination reveals a regular rate and rhythm, no obvious murmurs or rubs are noted.  Neuromuscular: Range of movement the cervical spine is full.  Skin: Extremities are without significant edema.  Neurologic Exam  Mental status: The patient is alert and oriented x 3 at the time of the examination. The patient has apparent normal recent and remote memory, with an apparently normal attention span and concentration ability.  Cranial nerves: Facial symmetry is present. There is good sensation of the face to pinprick and soft touch bilaterally. The strength of the facial muscles and the muscles to head turning and shoulder shrug are normal bilaterally. Speech is well enunciated, no aphasia or dysarthria is noted. Extraocular movements are full. Visual fields are full. The tongue is midline, and the patient has symmetric elevation of the soft palate. No obvious hearing deficits are noted.  Motor: The motor testing reveals 5 over 5 strength of all 4 extremities. Good symmetric motor tone is noted throughout.  Sensory: Sensory testing is intact to pinprick, soft touch, vibration sensation, and position sense on all 4 extremities, with exception that there is a slight decrease in pinprick sensation on the left leg as compared to the right. No evidence of extinction is noted.  Coordination: Cerebellar testing reveals good finger-nose-finger and heel-to-shin bilaterally.  Gait and station: Gait is normal. Tandem gait is  normal. Romberg is negative. No drift is seen.  Reflexes: Deep tendon reflexes are symmetric and normal bilaterally.  The ankle jerk reflexes are well-maintained bilaterally.  Toes are downgoing bilaterally.   Assessment/Plan:  1.  Paresthesias, all 4 extremities  2.  Chronic neck pain, shoulder discomfort  3.  Migraine headache  4.  Sudden hearing loss, AS, October 2018  The patient has had chronic symptoms involving the neck.  He will be sent for MRI of the cervical spine to exclude demyelinating disease or spinal cord impingement.  The patient has well maintained reflexes on all 4 extremities, no clear evidence of a peripheral neuropathy.  The patient did have borderline diabetes 1 year ago prior to his weight loss.  The patient will be sent for blood work today.  He will follow-up  in 4 or 5 months.  Jill Alexanders MD 10/24/2018 7:36 AM   [facility] [address] [place], [place] [address]  Phone [phone] Fax [phone]

## 2018-10-25 ENCOUNTER — Telehealth: Payer: Self-pay | Admitting: Neurology

## 2018-10-25 NOTE — Telephone Encounter (Signed)
Noted thank you

## 2018-10-25 NOTE — Telephone Encounter (Signed)
Patient called and stated that he wants to hold off on MRI and will call back to schedule. DW

## 2018-10-25 NOTE — Telephone Encounter (Signed)
lvm for pt to call back about scheduling mri  [employer] Auth: [id number] (exp. 10/25/18 to 12/23/18)

## 2018-10-26 LAB — VITAMIN B12: Vitamin B-12: 792 pg/mL (ref 232–1245)

## 2018-10-26 LAB — COMPREHENSIVE METABOLIC PANEL
ALT: 10 IU/L (ref 0–44)
AST: 12 IU/L (ref 0–40)
Albumin/Globulin Ratio: 1.7 (ref 1.2–2.2)
Albumin: 4.6 g/dL (ref 3.5–5.5)
Alkaline Phosphatase: 31 IU/L — ABNORMAL LOW (ref 39–117)
BUN/Creatinine Ratio: 12 (ref 9–20)
BUN: 14 mg/dL (ref 6–24)
Bilirubin Total: 2.4 mg/dL — ABNORMAL HIGH (ref 0.0–1.2)
CO2: 22 mmol/L (ref 20–29)
CREATININE: 1.21 mg/dL (ref 0.76–1.27)
Calcium: 9.7 mg/dL (ref 8.7–10.2)
Chloride: 99 mmol/L (ref 96–106)
GFR calc Af Amer: 85 mL/min/{1.73_m2} (ref >59)
GFR calc non Af Amer: 74 mL/min/{1.73_m2} (ref >59)
GLUCOSE: 80 mg/dL (ref 65–99)
Globulin, Total: 2.7 g/dL (ref 1.5–4.5)
Potassium: 4.2 mmol/L (ref 3.5–5.2)
Sodium: 140 mmol/L (ref 134–144)
Total Protein: 7.3 g/dL (ref 6.0–8.5)

## 2018-10-26 LAB — PAN-ANCA
ANCA Proteinase 3: <3.5 U/mL (ref 0.0–3.5)
Atypical pANCA: <1:20 {titer}
C-ANCA: <1:20 {titer}
Myeloperoxidase Ab: <9 U/mL (ref 0.0–9.0)
P-ANCA: <1:20 {titer}

## 2018-10-26 LAB — RPR: RPR Ser Ql: NONREACTIVE

## 2018-10-26 LAB — ANA W/REFLEX: ANA: NEGATIVE

## 2018-10-26 LAB — SEDIMENTATION RATE: Sed Rate: 2 mm/hr (ref 0–15)

## 2018-10-26 LAB — COPPER, SERUM: Copper: 78 ug/dL (ref 72–166)

## 2018-10-26 LAB — RHEUMATOID FACTOR: Rheumatoid fact SerPl-aCnc: <10 IU/mL (ref 0.0–13.9)

## 2018-10-26 LAB — B. BURGDORFI ANTIBODIES: Lyme IgG/IgM Ab: <0.91 {ISR} (ref 0.00–0.90)

## 2018-10-26 LAB — ANGIOTENSIN CONVERTING ENZYME: Angio Convert Enzyme: 33 U/L (ref 14–82)

## 2018-10-26 LAB — HIV ANTIBODY (ROUTINE TESTING W REFLEX): HIV Screen 4th Generation wRfx: NONREACTIVE

## 2018-11-14 ENCOUNTER — Encounter

## 2018-11-14 ENCOUNTER — Ambulatory Visit: Payer: BLUE CROSS/BLUE SHIELD | Admitting: Neurology

## 2018-12-12 ENCOUNTER — Telehealth: Payer: Self-pay | Admitting: Cardiovascular Disease

## 2018-12-12 NOTE — Telephone Encounter (Signed)
Called patient, LVM advised that lab orders are placed and they are not expired. Advised of lab times, and that they were fasting labs. Left call back number if questions.

## 2018-12-12 NOTE — Telephone Encounter (Signed)
New Message        Patient is calling to see if he can get a lab order put in. Patient was seen 03/2017. Pls call and advise.

## 2019-02-09 ENCOUNTER — Telehealth: Payer: Self-pay | Admitting: Cardiovascular Disease

## 2019-02-09 NOTE — Telephone Encounter (Signed)
LMTCB to change visit to virtual. Also asked to review MyChart message sent to him yesterday.

## 2019-02-09 NOTE — Telephone Encounter (Signed)
Smartphone/consent/ my chart/ pre reg completed °

## 2019-02-12 ENCOUNTER — Encounter

## 2019-02-12 ENCOUNTER — Telehealth (INDEPENDENT_AMBULATORY_CARE_PROVIDER_SITE_OTHER): Payer: BLUE CROSS/BLUE SHIELD | Admitting: Cardiovascular Disease

## 2019-02-12 ENCOUNTER — Encounter: Payer: Self-pay | Admitting: Cardiovascular Disease

## 2019-02-12 VITALS — Ht 72.0 in | Wt 180.0 lb

## 2019-02-12 DIAGNOSIS — E7849 Other hyperlipidemia: Secondary | ICD-10-CM | POA: Diagnosis not present

## 2019-02-12 DIAGNOSIS — Z87891 Personal history of nicotine dependence: Secondary | ICD-10-CM

## 2019-02-12 DIAGNOSIS — I10 Essential (primary) hypertension: Secondary | ICD-10-CM | POA: Diagnosis not present

## 2019-02-12 DIAGNOSIS — E782 Mixed hyperlipidemia: Secondary | ICD-10-CM | POA: Diagnosis not present

## 2019-02-12 DIAGNOSIS — R002 Palpitations: Secondary | ICD-10-CM

## 2019-02-12 NOTE — Progress Notes (Signed)
Virtual Visit via Video Note   This visit type was conducted due to national recommendations for restrictions regarding the COVID-19 Pandemic (e.g. social distancing) in an effort to limit this patient's exposure and mitigate transmission in our community.  Due to his co-morbid illnesses, this patient is at least at moderate risk for complications without adequate follow up.  This format is felt to be most appropriate for this patient at this time.  All issues noted in this document were discussed and addressed.  A limited physical exam was performed with this format.  Please refer to the patient's chart for his consent to telehealth for [facility].   Evaluation Performed:  Follow-up visit  Date:  02/12/2019   ID:  Anthony Garcia, DOB [birth year], MRN [id number]  Patient Location: Home Provider Location: Home  PCP:  Ferd Hibbs, NP  Cardiologist:  Shelva Majestic, MD Electrophysiologist:  None   Chief Complaint: 23-month follow-up evaluation with history previously of hypertension, significant hyperlipidemia, obesity, and prior tobacco use  History of Present Illness:    Anthony Garcia is a 43 y.o. male who was initiallyreferred for cardiology consultation through the request of Dr. Everette Rank for evaluation of palpitations, hyperlipidemia, and difficulty with sleep.  Mr. Perrell was born in [place].  His father was in [employer] as result, the patient lived in numerous places growing up, but he regards [place] as the place that he called home.  His father, who was very active, died suddenly from a heart attack at age 93.  He had a history of hyperlipidemia.  The patient states that over the past 20 years.  he has smoked off and on and for the past 21 days has been on Chantix and has not been smoking.  Prior to  moving to [place]  he was living in [place].  While in [place], he had some symptoms of vague chest pain or palpitations and apparently saw a cardiologist on one occasion and  had an exercise stress echo.  He was placed on medication for hypertension losartan HCT, and also was started on atorvastatin.  When his prescriptions ran out, he had stopped taking the medication.  For the past year, before moving to [place] he had lived in [place] , but did not seek medical attention.  Recently, he has noticed episodes of palpitations.  He denies that he is under significant increased stress.  He has 3 small children.  Because of palpitations, he was seen by Dr. Ralph Dowdy.  The patient states he was told that it was most likely due to anxiety.  He tells me she had wanted to prescribe Zoloft but he refused.  Later that night, he had recurrent symptoms and presented for emergency room evaluation.  No ectopy was detected.  He also had issues with lower extremity swelling.  A venous lower extremity ultrasound was negative for deep vein thrombosis.  Patient had been started back on losartan HCT 50/12.5 as well as atorvastatin 40 mg.  Baseline laboratory revealed a total cholesterol of 306, triglycerides 210, HDL 38, VLDL 42, and LDL cholesterol at 226.  The patient states over the past several weeks he has dramatically changed his diet, eating very poorly previously.  His palpitations have improved.  He denies significant caffeine use.  However, he still notes an occasional episode where his heart beats fast.  He also has been told that he stops breathing when he is lying on his back asleep.  He was told of snoring loudly.  Recently he has noticed nocturia at least 2-3 times per night.  He admits that he has not been very active.  He had sustained 5 left Garcia surgeries.  I saw him for initial evaluation on 01/04/2017.  Since that time, he has been on atorvastatin 40 mg.  At that time, I had a long discussion with him concerning recent literature with reference to subclinical atherosclerosis in the importance of aggressive treatment.  His ECG was stable.  I gave him a prescription for metoprolol,  tartrate, and he has taken 25 mg on 2 occasions.  His palpitations were better.  He has lost weight.  He has stopped smoking.  He was denied an inpatient sleep test and was tentatively scheduled for home study.  He believes he is sleeping better with weight loss and stopping smoking.    Since I last saw him he states that he changed his lifestyle and diet.  He had quit smoking, changed his diet, and since I last saw him he admits to a 50 pound weight loss with weight from 231 down to 180 pounds.  As result he stopped taking all his medications and now just takes over-the-counter supplements.  He is no longer taking his lipid-lowering therapy.  He apparently had repeat lab work in March 2019, 1 year after I had previously seen him.  Of note, off statin therapy his total cholesterol was 272, and his LDL cholesterol was 209.  Triglycerides were excellent with his dietary adjustment as well as his HDL which was now 45.  He believes he is feeling better off medication.  He denies chest pain PND orthopnea.  He also has undergone some follow-up lab work at another site.  He states his FTI was 2.5, free T3 2.9, TSH 0.591, but he had a slightly increased reverse T3.  He never followed up with his home sleep study.  He believes he is sleeping well is not snoring.  He denies daytime sleepiness.  He presents for 2-year evaluation.   The patient does not have symptoms concerning for COVID-19 infection (fever, chills, cough, or new shortness of breath).    Past Medical History:  Diagnosis Date  . Hyperlipidemia   . Hypertension    Past Surgical History:  Procedure Laterality Date  . ANTERIOR CRUCIATE LIGAMENT REPAIR Left    x3   . Garcia ARTHROSCOPY Left      Current Meds  Medication Sig  . GLUTATHIONE PO Take by mouth.  Marland Kitchen OVER THE COUNTER MEDICATION Resver  . OVER THE COUNTER MEDICATION K-2 supplement  . OVER THE COUNTER MEDICATION Electrolyte drink  . TURMERIC PO Take by mouth.  Marland Kitchen VITAMIN D PO Take  by mouth.     Allergies:   Patient has no known allergies.   Social History   Tobacco Use  . Smoking status: Former Smoker    Packs/day: 0.50    Years: 15.00    Pack years: 7.50    Types: Cigarettes  . Smokeless tobacco: Never Used  Substance Use Topics  . Alcohol use: No  . Drug use: Yes    Types: Marijuana     Family Hx: The patient's family history includes Anemia in his mother; Atrial fibrillation in his brother; Healthy in his son, son, and son; Heart attack in his father; Hyperlipidemia in his brother and father; Hypertension in his brother and father. There is no history of Allergic rhinitis, Angioedema, Asthma, Eczema, Immunodeficiency, or Urticaria.  ROS:   Please see the history of  present illness.    Positive for purposeful weight loss of 50 pounds since I last saw him 2 years previously No shortness of breath. No chest pain.  He notes a rare skipping beats.  No presyncope or syncope. No abdominal discomfort No tremor No edema Believes he is sleeping well aware of snoring or previous waking up gasping for breath He denies any neurologic symptoms  All other systems reviewed and are negative.   Labs/Other Tests and Data Reviewed:    EKG:  An ECG dated January 04, 2017 was personally reviewed today and demonstrated:  Normal sinus rhythm at 71 bpm.  Normal intervals.  No ST segment changes.  Recent Labs: 10/24/2018: ALT 10; BUN 14; Creatinine, Ser 1.21; Potassium 4.2; Sodium 140   Recent Lipid Panel Lab Results  Component Value Date/Time   CHOL 107 02/28/2017 08:48 AM   CHOL 306 (H) 12/16/2016 08:45 AM   TRIG 74 02/28/2017 08:48 AM   HDL 32 (L) 02/28/2017 08:48 AM   HDL 38 (L) 12/16/2016 08:45 AM   CHOLHDL 3.3 02/28/2017 08:48 AM   LDLCALC 60 02/28/2017 08:48 AM   LDLCALC 226 (H) 12/16/2016 08:45 AM    Wt Readings from Last 3 Encounters:  02/12/19 180 lb (81.6 kg)  10/24/18 186 lb (84.4 kg)  01/17/18 193 lb (87.5 kg)     Objective:    Vital Signs:   Ht 6' (1.829 m)   Wt 180 lb (81.6 kg)   BMI 24.41 kg/m    He states recent blood pressure was 107/6 mid 70s.  He currently does not have a blood pressure cuff at home.  Believes his pulse is in the 60s and 70s but has noticed a rare palpitation  He is well-developed and well-nourished in no acute distress Breathing is normal unlabored HEENT appears unremarkable There is no apparent neck vein distention There is no audible wheezing His pulse is regular on palpation.  He denies any chest wall tenderness to palpation Abdomen is nontender He denies any leg swelling He denies any neurologic symptoms He has a normal affect and mood  ASSESSMENT & PLAN:    1. 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  This had significantly improved in the past with Lipid-lowering therapy with atorvastatin such that his LDL cholesterol from 2 years ago at 226 had improved to 60 with treatment.  Over the past year and a half or more, he has lost 50 pounds and has significantly adjusted his lifestyle with discontinuance of cigarette smoking, improve diet, and weight loss.  This has resulted in significant lowering of his previous elevation of triglycerides such that her lipid panel in March 2019 showed triglycerides of 90, HDL 45.  However, his LDL cholesterol is back over 200 and was 209.  He felt this since all other parameters seem to be okay.  I had a long discussion with him and expressed my concern that most likely he has heterozygous familial hyperlipidemia contributing to his significant excessive LDL cholesterol greater than 200.  He also has a strong family history for premature coronary artery disease.  In addition, when I had seen him initially he had arcus cornealis as well as xanthelasmas.  It is my recommendation that we reinitiate statin therapy or PCSK9 inhibition with Repatha which is approved for familial  hyperlipidemia.  Presently, he would prefer not to initiate this.  However, he has agreed to undergo a calcium  score so that we can assess for coronary calcification.  If calcification is present I have strongly recommended reinitiation of lipid-lowering therapy and possible PCSK9 inhibition since perhaps he did not feel quite as well with statin therapy. 2. Premature family history for CAD with his father dying suddenly in his 82s. 3. Essential hypertension, blood pressure by recent assessment has been normal off treatment.  Previously had been on losartan HCT 50/12.5. 4. History of obstructive sleep apnea.  With his 50 pound weight loss he believes he is sleeping well and he never followed up with a subsequent evaluation 5. Prior tobacco use: Fortunately he quit smoking.  His breathing has improved 6. Palpitations: By his clinical assessment it appears he may be having an isolated either PAC or PVC.  He denies any sustained runs or awareness of any bigeminal, trigeminal rhythms.  COVID-19 Education: The signs and symptoms of COVID-19 were discussed with the patient and how to seek care for testing (follow up with PCP or arrange E-visit).  The importance of social distancing was discussed today.  Time:   Today, I have spent 23minutes with the patient with telehealth technology discussing the above problems.     Medication Adjustments/Labs and Tests Ordered: Current medicines are reviewed at length with the patient today.  Concerns regarding medicines are outlined above.   Tests Ordered: No orders of the defined types were placed in this encounter.   Medication Changes: No orders of the defined types were placed in this encounter.   Disposition:  Follow up: Plan to obtain a coronary calcium score.  He will undergo complete set of laboratory with a comprehensive metabolic panel, CBC, TSH, and I will see him in 3 months in the office in for follow-up evaluation  Signed, Shelva Majestic, MD   02/12/2019 9:16 AM    [place]

## 2019-02-12 NOTE — Patient Instructions (Signed)
Medication Instructions:  The current medical regimen is effective;  continue present plan and medications.  If you need a refill on your cardiac medications before your next appointment, please call your pharmacy.   Lab work: CMET, LIPID, TSH, CBC before follow up appointment in 3 months. If you have labs (blood work) drawn today and your tests are completely normal, you will receive your results only by: Marland Kitchen MyChart Message (if you have MyChart) OR . A paper copy in the mail If you have any lab test that is abnormal or we need to change your treatment, we will call you to review the results.  Testing/Procedures: Calcium score  Follow-Up: At [facility], you and your health needs are our priority.  As part of our continuing mission to provide you with exceptional heart care, we have created designated Provider Care Teams.  These Care Teams include your primary Cardiologist (physician) and Advanced Practice Providers (APPs -  Physician Assistants and Nurse Practitioners) who all work together to provide you with the care you need, when you need it. You will need a follow up appointment in 3 months.  Please call our office 2 months in advance to schedule this appointment.  You may see Dr.Kelly or one of the following Advanced Practice Providers on your designated Care Team: Almyra Deforest, [place] . Fabian Sharp, PA-C

## 2019-03-08 ENCOUNTER — Telehealth: Payer: Self-pay

## 2019-03-08 NOTE — Telephone Encounter (Signed)
Due to current COVID 19 pandemic, our office is severely reducing in office visits until further notice, in order to minimize the risk to our patients and healthcare providers.   I contacted the pt for the 3rd time Anthony Garcia called on 5/19 and Nicloe G sent a my chart message 02/20/19 to discuss moving this appt.   I requested the pt to call back as well.   Pt has been removed from 03/09/19 schedule.

## 2019-03-09 ENCOUNTER — Ambulatory Visit: Payer: BLUE CROSS/BLUE SHIELD | Admitting: Neurology

## 2019-05-15 ENCOUNTER — Inpatient Hospital Stay: Admission: RE | Admit: 2019-05-15 | Payer: BLUE CROSS/BLUE SHIELD | Source: Ambulatory Visit

## 2019-05-25 ENCOUNTER — Other Ambulatory Visit: Payer: Self-pay

## 2019-05-25 ENCOUNTER — Ambulatory Visit (INDEPENDENT_AMBULATORY_CARE_PROVIDER_SITE_OTHER)
Admission: RE | Admit: 2019-05-25 | Discharge: 2019-05-25 | Disposition: A | Discharge status: Home / Self Care | Payer: Self-pay | Source: Ambulatory Visit | Attending: Cardiovascular Disease | Admitting: Cardiovascular Disease

## 2019-05-25 DIAGNOSIS — I10 Essential (primary) hypertension: Secondary | ICD-10-CM

## 2019-05-25 DIAGNOSIS — E782 Mixed hyperlipidemia: Secondary | ICD-10-CM

## 2019-08-01 ENCOUNTER — Ambulatory Visit: Payer: BC Managed Care – PPO | Admitting: Physician Assistant

## 2019-08-08 ENCOUNTER — Ambulatory Visit: Payer: BC Managed Care – PPO | Admitting: Physician Assistant

## 2019-08-19 DIAGNOSIS — I4819 Other persistent atrial fibrillation: Secondary | ICD-10-CM

## 2019-08-19 HISTORY — DX: Other persistent atrial fibrillation: I48.19

## 2019-08-27 DIAGNOSIS — E782 Mixed hyperlipidemia: Secondary | ICD-10-CM | POA: Diagnosis not present

## 2019-08-27 DIAGNOSIS — I10 Essential (primary) hypertension: Secondary | ICD-10-CM | POA: Diagnosis not present

## 2019-08-28 LAB — COMPREHENSIVE METABOLIC PANEL
ALT: 14 IU/L (ref 0–44)
AST: 14 IU/L (ref 0–40)
Albumin/Globulin Ratio: 1.7 (ref 1.2–2.2)
Albumin: 4.4 g/dL (ref 4.0–5.0)
Alkaline Phosphatase: 33 IU/L — ABNORMAL LOW (ref 39–117)
BUN/Creatinine Ratio: 21 — ABNORMAL HIGH (ref 9–20)
BUN: 20 mg/dL (ref 6–24)
Bilirubin Total: 0.8 mg/dL (ref 0.0–1.2)
CO2: 23 mmol/L (ref 20–29)
Calcium: 8.9 mg/dL (ref 8.7–10.2)
Chloride: 102 mmol/L (ref 96–106)
Creatinine, Ser: 0.97 mg/dL (ref 0.76–1.27)
GFR calc Af Amer: 111 mL/min/{1.73_m2} (ref >59)
GFR calc non Af Amer: 96 mL/min/{1.73_m2} (ref >59)
Globulin, Total: 2.6 g/dL (ref 1.5–4.5)
Glucose: 88 mg/dL (ref 65–99)
Potassium: 4.4 mmol/L (ref 3.5–5.2)
Sodium: 138 mmol/L (ref 134–144)
Total Protein: 7 g/dL (ref 6.0–8.5)

## 2019-08-28 LAB — CBC
Hematocrit: 48.7 % (ref 37.5–51.0)
Hemoglobin: 15.7 g/dL (ref 13.0–17.7)
MCH: 28.7 pg (ref 26.6–33.0)
MCHC: 32.2 g/dL (ref 31.5–35.7)
MCV: 89 fL (ref 79–97)
Platelets: 215 10*3/uL (ref 150–450)
RBC: 5.47 x10E6/uL (ref 4.14–5.80)
RDW: 13.9 % (ref 11.6–15.4)
WBC: 5.5 10*3/uL (ref 3.4–10.8)

## 2019-08-28 LAB — LIPID PANEL
Chol/HDL Ratio: 5.4 ratio — ABNORMAL HIGH (ref 0.0–5.0)
Cholesterol, Total: 299 mg/dL — ABNORMAL HIGH (ref 100–199)
HDL: 55 mg/dL (ref >39)
LDL Chol Calc (NIH): 233 mg/dL — ABNORMAL HIGH (ref 0–99)
Triglycerides: 71 mg/dL (ref 0–149)
VLDL Cholesterol Cal: 11 mg/dL (ref 5–40)

## 2019-08-28 LAB — TSH: TSH: 1.1 u[IU]/mL (ref 0.450–4.500)

## 2019-08-30 ENCOUNTER — Ambulatory Visit: Payer: BC Managed Care – PPO | Admitting: Physician Assistant

## 2019-08-30 ENCOUNTER — Other Ambulatory Visit: Payer: Self-pay

## 2019-08-30 ENCOUNTER — Encounter: Payer: Self-pay | Admitting: Physician Assistant

## 2019-08-30 VITALS — BP 130/80 | HR 130 | Ht 74.0 in | Wt 193.2 lb

## 2019-08-30 DIAGNOSIS — R002 Palpitations: Secondary | ICD-10-CM | POA: Diagnosis not present

## 2019-08-30 DIAGNOSIS — I2584 Coronary atherosclerosis due to calcified coronary lesion: Secondary | ICD-10-CM

## 2019-08-30 DIAGNOSIS — I480 Paroxysmal atrial fibrillation: Secondary | ICD-10-CM | POA: Diagnosis not present

## 2019-08-30 DIAGNOSIS — Z9189 Other specified personal risk factors, not elsewhere classified: Secondary | ICD-10-CM | POA: Diagnosis not present

## 2019-08-30 DIAGNOSIS — Z01810 Encounter for preprocedural cardiovascular examination: Secondary | ICD-10-CM | POA: Diagnosis not present

## 2019-08-30 MED ORDER — METOPROLOL TARTRATE 50 MG PO TABS: ORAL_TABLET | ORAL | 0 refills | Status: DC

## 2019-08-30 NOTE — Progress Notes (Signed)
Cardiology Office Note:    Date:  09/01/2019   ID:  Anthony Garcia, DOB 23-Jun-1976, MRN [id number]  PCP:  Ferd Hibbs, NP  Cardiologist:  Shelva Majestic, MD  Electrophysiologist:  None   Referring MD: Ferd Hibbs, NP   Chief Complaint  Patient presents with  . Follow-up    palpitation, seen for Dr. Claiborne Billings.    History of Present Illness:    Anthony Garcia is a 43 y.o. male with a hx of hypertension, hyperlipidemia and palpitation.  Patient was born in [place] .  His father was an Social research officer, government.  His father who was very active died suddenly from heart attack at age 29.  He has smoked off and on in the past.  He previously saw cardiologist in [place] due to vague chest pain and palpitation.  He was placed on medication for hypertension include losartan HCT and started on Lipitor.  Later he moved to [place] area and then establish with Dr. Claiborne Billings. Coronary calcium scoring placed this patient at [place] percentile for age and sex matched controlled. Dr. Claiborne Billings recommended to start on statin therapy.   Patient presents today for cardiology office visit.  For the past 3 hours, he has been having palpitation.  EKG today shows he is in atrial fibrillation.  I reviewed the EKG with his primary cardiologist to Dr. Claiborne Billings.  He has low CHA2DS2-Vasc score of 1, I recommend at least start on a beta-blocker and aspirin.  He absolutely refused to take any scheduled medication.  I managed to convince him to use beta-blocker on a as needed basis to help convert him out of atrial fibrillation.  He says he probably has palpitation about once a month.  Dr. Claiborne Billings recommended a coronary CT given his high coronary calcium score.   Past Medical History:  Diagnosis Date  . Hyperlipidemia   . Hypertension     Past Surgical History:  Procedure Laterality Date  . ANTERIOR CRUCIATE LIGAMENT REPAIR Left    x3   . KNEE ARTHROSCOPY Left     Current Medications: Current Meds  Medication Sig  . OVER THE COUNTER  MEDICATION Electrolyte drink  . OVER THE COUNTER MEDICATION Take 1 capsule by mouth daily. [facility]  . VITAMIN D PO Take by mouth.     Allergies:   Patient has no known allergies.   Social History   Socioeconomic History  . Marital status: Married    Spouse name: [name]   . Number of children: 3  . Years of education: Not on file  . Highest education level: 12th grade  Occupational History  . Occupation: Public librarian [employer]  Social Needs  . Financial resource strain: Not on file  . Food insecurity    Worry: Not on file    Inability: Not on file  . Transportation needs    Medical: Not on file    Non-medical: Not on file  Tobacco Use  . Smoking status: Former Smoker    Packs/day: 0.50    Years: 15.00    Pack years: 7.50    Types: Cigarettes  . Smokeless tobacco: Never Used  Substance and Sexual Activity  . Alcohol use: No  . Drug use: Yes    Types: Marijuana  . Sexual activity: Yes  Lifestyle  . Physical activity    Days per week: Not on file    Minutes per session: Not on file  . Stress: Not on file  Relationships  . Social Herbalist on  phone: Not on file    Gets together: Not on file    Attends religious service: Not on file    Active member of club or organization: Not on file    Attends meetings of clubs or organizations: Not on file    Relationship status: Not on file  Other Topics Concern  . Not on file  Social History Narrative   Right handed    Caffeine 1 - 2 cups daily     Lives at home with wife/children      Family History: The patient's family history includes Anemia in his mother; Atrial fibrillation in his brother; Healthy in his son, son, and son; Heart attack in his father; Hyperlipidemia in his brother and father; Hypertension in his brother and father. There is no history of Allergic rhinitis, Angioedema, Asthma, Eczema, Immunodeficiency, or Urticaria.  ROS:   Please see the history of present illness.     All other systems  reviewed and are negative.  EKGs/Labs/Other Studies Reviewed:    The following studies were reviewed today:   EKG:  EKG is ordered today.  The ekg ordered today demonstrates atrial fibrillation, heart rate of 130.  Recent Labs: 08/27/2019: ALT 14; BUN 20; Creatinine, Ser 0.97; Hemoglobin 15.7; Platelets 215; Potassium 4.4; Sodium 138; TSH 1.100  Recent Lipid Panel    Component Value Date/Time   CHOL 299 (H) 08/27/2019 1430   TRIG 71 08/27/2019 1430   HDL 55 08/27/2019 1430   CHOLHDL 5.4 (H) 08/27/2019 1430   CHOLHDL 3.3 02/28/2017 0848   VLDL 15 02/28/2017 0848   LDLCALC 233 (H) 08/27/2019 1430    Physical Exam:    VS:  BP 130/80   Pulse (!) 130   Ht 6\' 2"  (1.88 m)   Wt 193 lb 3.2 oz (87.6 kg)   SpO2 99%   BMI 24.81 kg/m     Wt Readings from Last 3 Encounters:  08/30/19 193 lb 3.2 oz (87.6 kg)  02/12/19 180 lb (81.6 kg)  10/24/18 186 lb (84.4 kg)     GEN:  Well nourished, well developed in no acute distress HEENT: Normal NECK: No JVD; No carotid bruits LYMPHATICS: No lymphadenopathy CARDIAC: Irregularly irregular, tachycardic, no murmurs, rubs, gallops RESPIRATORY:  Clear to auscultation without rales, wheezing or rhonchi  ABDOMEN: Soft, non-tender, non-distended MUSCULOSKELETAL:  No edema; No deformity  SKIN: Warm and dry NEUROLOGIC:  Alert and oriented x 3 PSYCHIATRIC:  Normal affect   ASSESSMENT:    1. PAF (paroxysmal atrial fibrillation) ([place])   2. Other specified personal risk factors, not elsewhere classified   3. Pre-procedural cardiovascular examination   4. Palpitations   5. Coronary atherosclerosis due to severely calcified coronary lesion    PLAN:    In order of problems listed above:  1. PAF: He is currently in atrial fibrillation with RVR.  He typically stay atrial fibrillation anywhere from 2 to 5 hours.  He has very low CHA2DS2-Vasc score of 1.  Patient refuses to take scheduled medication, I managed to convince him to use metoprolol on a  as needed basis.  I feel he will need at least 81 mg aspirin, however he says he will need to think about this as he does not like to take medication on a daily basis.  He attributed a lot of his issues to undiagnosed rheumatological disorder.  2. Elevated coronary calcium score: I discussed the case with Dr. Claiborne Billings, he recommended a coronary CT given his significant family history and a  three-vessel calcification that was seen on recent CT calcium scoring.   Medication Adjustments/Labs and Tests Ordered: Current medicines are reviewed at length with the patient today.  Concerns regarding medicines are outlined above.  Orders Placed This Encounter  Procedures  . CT CORONARY MORPH W/CTA COR W/SCORE W/CA W/CM &/OR WO/CM  . CT CORONARY FRACTIONAL FLOW RESERVE DATA PREP  . CT CORONARY FRACTIONAL FLOW RESERVE FLUID ANALYSIS  . Basic metabolic panel  . EKG 12-Lead   Meds ordered this encounter  Medications  . metoprolol tartrate (LOPRESSOR) 50 MG tablet    Sig: Take 2 hours prior to procedure    Dispense:  1 tablet    Refill:  0    Patient Instructions  Medication Instructions:   Continue Metoprolol Tartrate 25 mg as needed for palpitations  *If you need a refill on your cardiac medications before your next appointment, please call your pharmacy*  Lab Work: You will need to have labs (blood work) drawn 1 week prior to the Coronary CT:  BMET If you have labs (blood work) drawn today and your tests are completely normal, you will receive your results only by: Marland Kitchen MyChart Message (if you have MyChart) OR . A paper copy in the mail If you have any lab test that is abnormal or we need to change your treatment, we will call you to review the results.  Testing/Procedures: Your physician has requested that you have cardiac CT. Cardiac computed tomography (CT) is a painless test that uses an x-ray machine to take clear, detailed pictures of your heart. For further information please visit  [facility]uge[facility]iesta[facility][email or web address]. Please follow instruction sheet as given.  Follow-Up: At [facility], you and your health needs are our priority.  As part of our continuing mission to provide you with exceptional heart care, we have created designated Provider Care Teams.  These Care Teams include your primary Cardiologist (physician) and Advanced Practice Providers (APPs -  Physician Assistants and Nurse Practitioners) who all work together to provide you with the care you need, when you need it.  Your next appointment:   3 months  The format for your next appointment:   In Person  Provider:   Shelva Majestic, MD  Other Instructions  Follow up in 2 weeks to have an EKG in office   Consider taking Aspirin 81 mg daily  Your cardiac CT will be scheduled at one of the below locations:   [facility] [address] [place], [place] [address] (336) [place] [address] [place], [place] [address] [phone]  If scheduled at [facility], please arrive at the [facility] main entrance of [facility] 30-45 minutes prior to test start time. Proceed to the [facility] Radiology Department (first floor) to check-in and test prep.  If scheduled at [facility], please arrive 15 mins early for check-in and test prep.  Please follow these instructions carefully (unless otherwise directed):  Hold all erectile dysfunction medications at least 3 days (72 hrs) prior to test.  On the Night Before the Test: . Be sure to Drink plenty of water. . Do not consume any caffeinated/decaffeinated beverages or chocolate 12 hours prior to your test. . Do not take any antihistamines 12 hours prior to your test. . If the patient has contrast allergy: ? Patient will need a prescription for Prednisone and very clear instructions (as follows): 1. Prednisone 50 mg - take 13 hours  prior to test 2.  Take another Prednisone 50 mg 7 hours prior to test 3. Take another Prednisone 50 mg 1 hour prior to test 4. Take Benadryl 50 mg 1 hour prior to test . Patient must complete all four doses of above prophylactic medications. . Patient will need a ride after test due to Benadryl.  On the Day of the Test: . Drink plenty of water. Do not drink any water within one hour of the test. . Do not eat any food 4 hours prior to the test. . You may take your regular medications prior to the test.  . Take metoprolol (Lopressor) two hours prior to test. . HOLD Furosemide/Hydrochlorothiazide morning of the test. . FEMALES- please wear underwire-free bra if available   *For Clinical Staff only. Please instruct patient the following:*        -Drink plenty of water       -Hold Furosemide/hydrochlorothiazide morning of the test       -Take metoprolol (Lopressor) 2 hours prior to test (if applicable).                  -If HR is less than 55 BPM- No Beta Blocker                -IF HR is greater than 55 BPM and patient is less than or equal to 58 yrs old Lopressor 100mg  x1.                -If HR is greater than 55 BPM and patient is greater than 35 yrs old Lopressor 50 mg x1.     Do not give Lopressor to patients with an allergy to lopressor or anyone with asthma or active COPD symptoms (currently taking steroids).       After the Test: . Drink plenty of water. . After receiving IV contrast, you may experience a mild flushed feeling. This is normal. . On occasion, you may experience a mild rash up to 24 hours after the test. This is not dangerous. If this occurs, you can take Benadryl 25 mg and increase your fluid intake. . If you experience trouble breathing, this can be serious. If it is severe call 911 IMMEDIATELY. If it is mild, please call our office. . If you take any of these medications: Glipizide/Metformin, Avandament, Glucavance, please do not take 48 hours after completing test unless otherwise  instructed.   Once we have confirmed authorization from your insurance company, we will call you to set up a date and time for your test.   For non-scheduling related questions, please contact the cardiac imaging nurse navigator should you have any questions/concerns: Marchia Bond, RN Navigator Cardiac Imaging [facility] Heart and Vascular Services [phone] Office        Signed, Almyra Deforest, [place]  09/01/2019 11:32 PM    [place]

## 2019-08-30 NOTE — Patient Instructions (Addendum)
Medication Instructions:   Continue Metoprolol Tartrate 25 mg as needed for palpitations  *If you need a refill on your cardiac medications before your next appointment, please call your pharmacy*  Lab Work: You will need to have labs (blood work) drawn 1 week prior to the Coronary CT:  BMET If you have labs (blood work) drawn today and your tests are completely normal, you will receive your results only by: Marland Kitchen MyChart Message (if you have MyChart) OR . A paper copy in the mail If you have any lab test that is abnormal or we need to change your treatment, we will call you to review the results.  Testing/Procedures: Your physician has requested that you have cardiac CT. Cardiac computed tomography (CT) is a painless test that uses an x-ray machine to take clear, detailed pictures of your heart. For further information please visit [facility]iesta[facility]tn. Please follow instruction sheet as given.  Follow-Up: At [facility], you and your health needs are our priority.  As part of our continuing mission to provide you with exceptional heart care, we have created designated Provider Care Teams.  These Care Teams include your primary Cardiologist (physician) and Advanced Practice Providers (APPs -  Physician Assistants and Nurse Practitioners) who all work together to provide you with the care you need, when you need it.  Your next appointment:   3 months  The format for your next appointment:   In Person  Provider:   Shelva Majestic, MD  Other Instructions  Follow up in 2 weeks to have an EKG in office   Consider taking Aspirin 81 mg daily  Your cardiac CT will be scheduled at one of the below locations:   [facility] [address] [place], [place] [address] (336) [place] [address] [place],  [address] [phone]  If scheduled at [facility], please arrive at the [facility] main  entrance of [facility] 30-45 minutes prior to test start time. Proceed to the [facility] Radiology Department (first floor) to check-in and test prep.  If scheduled at [facility], please arrive 15 mins early for check-in and test prep.  Please follow these instructions carefully (unless otherwise directed):  Hold all erectile dysfunction medications at least 3 days (72 hrs) prior to test.  On the Night Before the Test: . Be sure to Drink plenty of water. . Do not consume any caffeinated/decaffeinated beverages or chocolate 12 hours prior to your test. . Do not take any antihistamines 12 hours prior to your test. . If the patient has contrast allergy: ? Patient will need a prescription for Prednisone and very clear instructions (as follows): 1. Prednisone 50 mg - take 13 hours prior to test 2. Take another Prednisone 50 mg 7 hours prior to test 3. Take another Prednisone 50 mg 1 hour prior to test 4. Take Benadryl 50 mg 1 hour prior to test . Patient must complete all four doses of above prophylactic medications. . Patient will need a ride after test due to Benadryl.  On the Day of the Test: . Drink plenty of water. Do not drink any water within one hour of the test. . Do not eat any food 4 hours prior to the test. . You may take your regular medications prior to the test.  . Take metoprolol (Lopressor) two hours prior to test. . HOLD Furosemide/Hydrochlorothiazide morning of the test. . FEMALES- please wear underwire-free  bra if available   *For Clinical Staff only. Please instruct patient the following:*        -Drink plenty of water       -Hold Furosemide/hydrochlorothiazide morning of the test       -Take metoprolol (Lopressor) 2 hours prior to test (if applicable).                  -If HR is less than 55 BPM- No Beta Blocker                -IF HR is greater than 55 BPM and patient is less than or equal to 34 yrs old Lopressor 100mg  x1.                 -If HR is greater than 55 BPM and patient is greater than 16 yrs old Lopressor 50 mg x1.     Do not give Lopressor to patients with an allergy to lopressor or anyone with asthma or active COPD symptoms (currently taking steroids).       After the Test: . Drink plenty of water. . After receiving IV contrast, you may experience a mild flushed feeling. This is normal. . On occasion, you may experience a mild rash up to 24 hours after the test. This is not dangerous. If this occurs, you can take Benadryl 25 mg and increase your fluid intake. . If you experience trouble breathing, this can be serious. If it is severe call 911 IMMEDIATELY. If it is mild, please call our office. . If you take any of these medications: Glipizide/Metformin, Avandament, Glucavance, please do not take 48 hours after completing test unless otherwise instructed.   Once we have confirmed authorization from your insurance company, we will call you to set up a date and time for your test.   For non-scheduling related questions, please contact the cardiac imaging nurse navigator should you have any questions/concerns: Marchia Bond, RN Navigator Cardiac Imaging [facility] [phone] Office

## 2019-09-01 ENCOUNTER — Encounter: Payer: Self-pay | Admitting: Physician Assistant

## 2019-09-04 ENCOUNTER — Other Ambulatory Visit: Payer: Self-pay | Admitting: Physician Assistant

## 2019-09-04 NOTE — Telephone Encounter (Signed)
°*  STAT* If patient is at the pharmacy, call can be transferred to refill team.   1. Which medications need to be refilled? e(please list name of each medication and dose if known) Metoprolol 50 mg  2. Which pharmacy/location (including street and city if local pharmacy) is medication to be sent to? [facility]  3. Do they need a 30 day or 90 day supply? Patient not sure

## 2019-09-17 ENCOUNTER — Telehealth: Payer: Self-pay | Admitting: Cardiovascular Disease

## 2019-09-17 MED ORDER — METOPROLOL TARTRATE 25 MG PO TABS: ORAL_TABLET | ORAL | 1 refills | Status: DC

## 2019-09-17 NOTE — Telephone Encounter (Signed)
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 If he is no longer taking his statin, resume rosuvastatin 40 mg and recheck lipids/LFTs in 2-3 months. If he is still taking a statin, recommend Pharm.D. evaluation for initiation of Repatha. Of note, I had discussed PCSK9 inhibition at a previous telemedicine visit and at the time he was not interested.  Patient called with results. He is not taking statin and is not interested in statin therapy at this time. He has family h/o premature CAD (father died in 36s of MI). 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 He prefers to discuss with MD at upcoming visit  Of note, he did not get refill for metoprolol tartrate 25mg  PRN per [place] PA visit earlier this month. [facility]

## 2019-10-01 DIAGNOSIS — Z3009 Encounter for other general counseling and advice on contraception: Secondary | ICD-10-CM | POA: Diagnosis not present

## 2019-10-31 ENCOUNTER — Encounter: Payer: Self-pay | Admitting: Cardiovascular Disease

## 2019-10-31 ENCOUNTER — Ambulatory Visit: Payer: 59 | Admitting: Cardiovascular Disease

## 2019-10-31 ENCOUNTER — Other Ambulatory Visit: Payer: Self-pay

## 2019-10-31 VITALS — BP 132/85 | HR 90 | Temp 97.3°F | Ht 74.0 in | Wt 194.2 lb

## 2019-10-31 DIAGNOSIS — R931 Abnormal findings on diagnostic imaging of heart and coronary circulation: Secondary | ICD-10-CM | POA: Diagnosis not present

## 2019-10-31 DIAGNOSIS — I10 Essential (primary) hypertension: Secondary | ICD-10-CM

## 2019-10-31 DIAGNOSIS — Z87891 Personal history of nicotine dependence: Secondary | ICD-10-CM

## 2019-10-31 DIAGNOSIS — E7849 Other hyperlipidemia: Secondary | ICD-10-CM | POA: Diagnosis not present

## 2019-10-31 DIAGNOSIS — H0266 Xanthelasma of left eye, unspecified eyelid: Secondary | ICD-10-CM

## 2019-10-31 DIAGNOSIS — I4891 Unspecified atrial fibrillation: Secondary | ICD-10-CM | POA: Diagnosis not present

## 2019-10-31 DIAGNOSIS — H0263 Xanthelasma of right eye, unspecified eyelid: Secondary | ICD-10-CM

## 2019-10-31 DIAGNOSIS — H9192 Unspecified hearing loss, left ear: Secondary | ICD-10-CM

## 2019-10-31 DIAGNOSIS — H18413 Arcus senilis, bilateral: Secondary | ICD-10-CM

## 2019-10-31 MED ORDER — RIVAROXABAN 20 MG PO TABS: 20.0000 mg | ORAL_TABLET | Freq: Every day | ORAL | 6 refills | Status: DC

## 2019-10-31 MED ORDER — METOPROLOL SUCCINATE ER 50 MG PO TB24: 50.0000 mg | ORAL_TABLET | Freq: Every day | ORAL | 3 refills | Status: DC

## 2019-10-31 NOTE — Progress Notes (Signed)
Cardiology Office Note    Date:  11/04/2019   ID:  Anthony Garcia, DOB 1976-05-10, MRN [id number]  PCP:  Anthony Hibbs, NP  Cardiologist:  Anthony Majestic, MD   History of Present Illness:  Anthony Garcia is a 44 y.o. male who was initially referred for cardiology consultation through the request of Anthony Garcia for evaluation of palpitations, hyperlipidemia, and difficulty with sleep.  [place] Garcia was born in [place]. His father was in [employer] as result, the patient lived in numerous places growing up, but he regards [place] as the place that he called home. His father, who was very active, died suddenly from a heart attack at age 25. He had a history of hyperlipidemia. The patient states that over the past 20 years. he has smoked off and on and for the past 21 days has been on Chantix and has not been smoking. Prior to moving to [place] he was living in [place]. While in [place], he had some symptoms of vague chest pain or palpitations and apparently saw a cardiologist on one occasion and had an exercise stress echo. He was placed on medication for hypertension losartan HCT, and also was started on atorvastatin. When his prescriptions ran out, he had stopped taking the medication. For the past year, before moving to [place] he had lived in [place] , but did not seek medical attention. Recently, he has noticed episodes of palpitations. He denies that he is under significant increased stress. He has 3 small children. Because of palpitations, he was seen by Anthony Garcia. The patient states he was told that it was most likely due to anxiety. He tells me she had wanted to prescribe Zoloft but he refused. Later that night, he had recurrent symptoms and presented for emergency room evaluation. No ectopy was detected. He also had issues with lower extremity swelling. A venous lower extremity ultrasound was negative for deep vein thrombosis. Patient had been started back on  losartan HCT 50/12.5 as well as atorvastatin 40 mg. Baseline laboratory revealed a total cholesterol of 306, triglycerides 210, HDL 38, VLDL 42, and LDL cholesterol at 226. The patient states over the past several weeks he has dramatically changed his diet, eating very poorly previously. His palpitations have improved. He denies significant caffeine use. However, he still notes an occasional episode where his heart beats fast. He also has been told that he stops breathing when he is lying on his back asleep. He was told of snoring loudly. Recently he has noticed nocturia at least 2-3 times per night. He admits that he has not been very active. He had sustained 5 left knee surgeries.   I saw him for initial evaluation on 01/04/2017. Since that time, he has been on atorvastatin 40 mg. At that time, I had a long discussion with him concerning recent literature with reference to subclinical atherosclerosis in the importance of aggressive treatment. His ECG was stable. I gave him a prescription for metoprolol, tartrate, and he has taken 25 mg on 2 occasions. His palpitations were better. He has lost weight. He has stopped smoking. He was denied an inpatient sleep test and was tentatively scheduled for home study. He believes he is sleeping better with weight loss and stopping smoking.   I saw him in follow-up April 2020 in a telemedicine visit .  Since his initial evaluation with me in 2018 he had changed his lifestyle and diet.  He quit smoking, changed his diet, and had a 50 pound weight  loss with weight from 231 down to 180 pounds.  As result he stopped taking all his medications and now just takes over-the-counter supplements.  He was no longer taking his lipid-lowering therapy.  He apparently had repeat lab work in March 2019, 1 year after I had previously seen him.  Of note, off statin therapy his total cholesterol was 272, and his LDL cholesterol was 209.  Triglycerides were excellent  with his dietary adjustment as well as his HDL which was now 45.  He believes he is feeling better off medication.  He denies chest pain PND orthopnea.  He also has undergone some follow-up lab work at another site.  He states his FTI was 2.5, free T3 2.9, TSH 0.591, but he had a slightly increased reverse T3. He never followed up with his home sleep study.  With his weight loss he was sleeping well, not snoring, and denied residual daytime sleepiness.  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  He also had a very strong family history for premature CAD.  In addition when I had initially seen him he had arcus [name] as well as xanthelasmas.  During my evaluation I strongly recommended reinitiation of statin therapy or PCSK9 inhibition with Repatha.  However, he preferred not to initiate therapy.  However he agreed to undergo a calcium score to assess for coronary calcification and if present I discussed I would strongly reinstitute therapy.  He ultimately underwent a calcium score which was increased at 269 and demonstrated age advanced three-vessel coronary calcification most dense in the proximal LAD.  This was 98th percentile for age and sex matched control.  He was evaluated in November 2020 by Anthony Deforest, PA-C and at that time had complaints of palpitations.  His ECG demonstrated atrial fibrillation.  He was advised that he start medication but he was adamant not to take any scheduled medications.  Over the past several months, at times he does note some heart rate irregularity but he believes most of the time his heart rate and rhythm have been stable.  He has not taken any medications.  He underwent follow-up lipid studies in November 2020 which again showed a markedly elevated total cholesterol at 299 and LDL cholesterol at 233, highly suggestive of  heterozygous familial hyperlipidemia.  With his dietary adjustments, his triglycerides and HDL are excellent at 71 and 55, respectively.  VLDL is excellent at 11.  He presents for evaluation.  Past Medical History:  Diagnosis Date  . Hyperlipidemia   . Hypertension     Past Surgical History:  Procedure Laterality Date  . ANTERIOR CRUCIATE LIGAMENT REPAIR Left    x3   . KNEE ARTHROSCOPY Left     Current Medications: Outpatient Medications Prior to Visit  Medication Sig Dispense Refill  . metoprolol tartrate (LOPRESSOR) 25 MG tablet Take 1 tablet by mouth as needed for palpitations. (Patient not taking: Reported on 10/31/2019) 30 tablet 1  . metoprolol tartrate (LOPRESSOR) 50 MG tablet Take 2 hours prior to procedure (Patient not taking: Reported on 10/31/2019) 1 tablet 0  . OVER THE COUNTER MEDICATION Electrolyte drink    . OVER THE COUNTER MEDICATION Take 1 capsule by mouth daily. [facility]    . VITAMIN D PO Take by mouth.     No facility-administered medications prior to visit.     Allergies:   Patient has no known allergies.  Social History   Socioeconomic History  . Marital status: Married    Spouse name: [name]   . Number of children: 3  . Years of education: Not on file  . Highest education level: 12th grade  Occupational History  . Occupation: [employer]  Tobacco Use  . Smoking status: Former Smoker    Packs/day: 0.50    Years: 15.00    Pack years: 7.50    Types: Cigarettes  . Smokeless tobacco: Never Used  Substance and Sexual Activity  . Alcohol use: No  . Drug use: Yes    Types: Marijuana  . Sexual activity: Yes  Other Topics Concern  . Not on file  Social History Narrative   Right handed    Caffeine 1 - 2 cups daily     Lives at home with wife/children    Social Determinants of Health   Financial Resource Strain:   . Difficulty of Paying Living Expenses: Not on file  Food Insecurity:   . Worried About Charity fundraiser in the Last  Year: Not on file  . Ran Out of Food in the Last Year: Not on file  Transportation Needs:   . Lack of Transportation (Medical): Not on file  . Lack of Transportation (Non-Medical): Not on file  Physical Activity:   . Days of Exercise per Week: Not on file  . Minutes of Exercise per Session: Not on file  Stress:   . Feeling of Stress : Not on file  Social Connections:   . Frequency of Communication with Friends and Family: Not on file  . Frequency of Social Gatherings with Friends and Family: Not on file  . Attends Religious Services: Not on file  . Active Member of Clubs or Organizations: Not on file  . Attends Archivist Meetings: Not on file  . Marital Status: Not on file     Family History:  The patient's family history includes Anemia in his mother; Atrial fibrillation in his brother; Healthy in his son, son, and son; Heart attack in his father; Hyperlipidemia in his brother and father; Hypertension in his brother and father.   ROS General: Negative; No fevers, chills, or night sweats;  HEENT: Negative; No changes in vision or hearing, sinus congestion, difficulty swallowing Pulmonary: Negative; No cough, wheezing, shortness of breath, hemoptysis Cardiovascular:  Occasional palpitations, no chest pain, presyncope, syncope,  GI: Negative; No nausea, vomiting, diarrhea, or abdominal pain GU: Negative; No dysuria, hematuria, or difficulty voiding Musculoskeletal: Negative; no myalgias, joint pain, or weakness Hematologic/Oncology: Negative; no easy bruising, bleeding Endocrine: Negative; no heat/cold intolerance; no diabetes Neuro: Negative; no changes in balance, headaches Skin: Negative; No rashes or skin lesions Psychiatric: Negative; No behavioral problems, depression Sleep: Negative; No snoring, daytime sleepiness, hypersomnolence, bruxism, restless legs, hypnogognic hallucinations, no cataplexy Other comprehensive 14 point system review is negative.   PHYSICAL  EXAM:   VS:  BP 132/85   Pulse 90   Temp (!) 97.3 F (36.3 C)   Ht 6' 2"  (1.88 m)   Wt 194 lb 3.2 oz (88.1 kg)   SpO2 100%   BMI 24.93 kg/m     Repeat blood pressure by me 136/82  Wt Readings from Last 3 Encounters:  10/31/19 194 lb 3.2 oz (88.1 kg)  08/30/19 193 lb 3.2 oz (87.6 kg)  02/12/19 180 lb (81.6 kg)    General: Alert, oriented, no distress.  Skin: normal turgor, no rashes, warm and dry HEENT: Positive for xanthelasmas;  arcus cornealis; he is deaf in his left ear; normocephalic, atraumatic. Pupils equal round and reactive to light; sclera anicteric; extraocular muscles intact;  Nose without nasal septal hypertrophy Mouth/Parynx benign; Mallinpatti scale 2 Neck: No JVD, no carotid bruits; normal carotid upstroke Lungs: clear to ausculatation and percussion; no wheezing or rales Chest wall: without tenderness to palpitation Heart: PMI not displaced, RRR, s1 s2 normal, 1/6 systolic murmur, no diastolic murmur, no rubs, gallops, thrills, or heaves Abdomen: soft, nontender; no hepatosplenomehaly, BS+; abdominal aorta nontender and not dilated by palpation. Back: no CVA tenderness Pulses 2+ Musculoskeletal: full range of motion, normal strength, no joint deformities Extremities: no clubbing cyanosis or edema, Homan's sign negative  Neurologic: grossly nonfocal; Cranial nerves grossly wnl Psychologic: Normal mood and affect   Studies/Labs Reviewed:   EKG:  EKG is ordered today.  ECG (independently read by me): Atrial fibrillation with a ventricular rate at 90 bpm, isolated PVC.  Recent Labs: BMP Latest Ref Rng & Units 08/27/2019 10/24/2018 02/28/2017  Glucose 65 - 99 mg/dL 88 80 102(H)  BUN 6 - 24 mg/dL 20 14 16   Creatinine 0.76 - 1.27 mg/dL 0.97 1.21 1.16  BUN/Creat Ratio 9 - 20 21(H) 12 -  Sodium 134 - 144 mmol/L 138 140 139  Potassium 3.5 - 5.2 mmol/L 4.4 4.2 4.2  Chloride 96 - 106 mmol/L 102 99 103  CO2 20 - 29 mmol/L 23 22 25   Calcium 8.7 - 10.2 mg/dL 8.9 9.7 9.0      Hepatic Function Latest Ref Rng & Units 08/27/2019 10/24/2018 02/28/2017  Total Protein 6.0 - 8.5 g/dL 7.0 7.3 6.7  Albumin 4.0 - 5.0 g/dL 4.4 4.6 4.4  AST 0 - 40 IU/L 14 12 14   ALT 0 - 44 IU/L 14 10 19   Alk Phosphatase 39 - 117 IU/L 33(L) 31(L) 29(L)  Total Bilirubin 0.0 - 1.2 mg/dL 0.8 2.4(H) 1.0    CBC Latest Ref Rng & Units 08/27/2019 11/11/2016 11/09/2016  WBC 3.4 - 10.8 x10E3/uL 5.5 7.5 12.0(H)  Hemoglobin 13.0 - 17.7 g/dL 15.7 16.0 16.0  Hematocrit 37.5 - 51.0 % 48.7 46.2 46.3  Platelets 150 - 450 x10E3/uL 215 237 238   Lab Results  Component Value Date   MCV 89 08/27/2019   MCV 87 11/11/2016   MCV 85.6 11/09/2016   Lab Results  Component Value Date   TSH 1.100 08/27/2019   Lab Results  Component Value Date   HGBA1C 5.9 (H) 11/11/2016     BNP No results found for: BNP  ProBNP No results found for: PROBNP   Lipid Panel     Component Value Date/Time   CHOL 299 (H) 08/27/2019 1430   TRIG 71 08/27/2019 1430   HDL 55 08/27/2019 1430   CHOLHDL 5.4 (H) 08/27/2019 1430   CHOLHDL 3.3 02/28/2017 0848   VLDL 15 02/28/2017 0848   LDLCALC 233 (H) 08/27/2019 1430   LABVLDL 11 08/27/2019 1430     RADIOLOGY: No results found.   Additional studies/ records that were reviewed today include:   CT Cardiac Scoring  FINDINGS: Non-cardiac: See separate report from [facility].  Ascending aorta: Normal diameter 3.2 cm  Pericardium: Normal  Coronary arteries: Age advance 3 vessel coronary calcification most dense in the proximal LAD  IMPRESSION: Coronary calcium score of 269. This was 31 th percentile for age and sex matched control.   ASSESSMENT:    1. Atrial fibrillation, unspecified type ([place])   2. Familial hyperlipidemia   3. Essential hypertension  4. Agatston coronary artery calcium score between 200 and 399   5. Remote History of tobacco abuse   6. Xanthelasma of eyelid, bilateral   7. 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  Since 2018 he is had major lifestyle changes and has lost in excess of 50 pounds, quit smoking, and essentially stopped all his medications.  Remotely, he had been on statin therapy and in May 2018 and LDL cholesterol was normal at 60.  He has not been on any lipid-lowering therapy in several years.  Despite his major lifestyle adjustments, he continues to have significant total cholesterol and LDL cholesterol elevation consistent with heterozygous familial hyperlipidemia.  There is a strong family history for premature CAD in his father died suddenly with a heart attack at age 20.  I had a very long discussion again with him in the office today discussing the importance of initiating therapy with the greatest impact occurring with treatment starting at an earlier age.  I reviewed his cardiac scoring which is significantly abnormal and demonstrates multivessel coronary calcification and with a calcium score of 269 places him at the [place] percentile for age and sex matched control.  I again discussed the benefits of aggressive statin therapy as well as PCSK9 inhibition.  However, despite a long discussion regarding the importance of treatment, he adamantly refuses to initiate therapy at this time.  In addition, his ECG today confirms that he is in atrial fibrillation.  When he was evaluated in November 2020 by Anthony Garcia he was in A. fib at that time as well.  As result I have strongly recommended institution of anticoagulation therapy as well as beta-blocker therapy.  However, he does not want to start any therapy.  I specifically discussed the potential thromboembolic risk associated with persistent atrial fibrillation and a particular stroke risk.  He is on 44 years old and has young children.  I  will place a prescription for Xarelto 20 mg to take once a day if he ultimately chooses to initiate therapy as well as metoprolol succinate 50 mg to initiate therapy with 1/2 tablet by mouth for the first week and then increase to 50 mils daily if no significant rate reduction.  I also have recommended a coronary CTA to assess for percent plaque stenosis with his multivessel coronary calcification.  With his atrial fibrillation I have recommended an echo Doppler study.  I will see him in person in 1 month for follow-up evaluation  Time spent: 50 minutes  Medication Adjustments/Labs and Tests Ordered: Current medicines are reviewed at length with the patient today.  Concerns regarding medicines are outlined above.  Medication changes, Labs and Tests ordered today are listed in the Patient Instructions below. Patient Instructions  Medication Instructions:  START- Xarelto 20 mg by mouth daily START- Metoprolol Succinate 50 mg take 1/2 tablet(25 mg) by mouth for 1 week, then if no change take 50 mg by mouth daily   *If you need a refill on your cardiac medications before your next appointment, please call your pharmacy*  Lab Work: None Ordered  Testing/Procedures: Your physician has requested that you have an echocardiogram. Echocardiography is a painless test that uses sound waves to create images of your heart. It provides your doctor with information about  the size and shape of your heart and how well your heart's chambers and valves are working. This procedure takes approximately one hour. There are no restrictions for this procedure.  Follow-Up: At [facility], you and your health needs are our priority.  As part of our continuing mission to provide you with exceptional heart care, we have created designated Provider Care Teams.  These Care Teams include your primary Cardiologist (physician) and Advanced Practice Providers (APPs -  Physician Assistants and Nurse Practitioners) who all work  together to provide you with the care you need, when you need it.  Your next appointment:   1 month(s)  The format for your next appointment:   In Person  Provider:   Shelva Majestic, MD       Signed, Anthony Majestic, MD  11/04/2019 1:01 PM    [place] [address], [place], [place], [place]  [address] Phone: [phone]

## 2019-10-31 NOTE — Patient Instructions (Signed)
Medication Instructions:  START- Xarelto 20 mg by mouth daily START- Metoprolol Succinate 50 mg take 1/2 tablet(25 mg) by mouth for 1 week, then if no change take 50 mg by mouth daily   *If you need a refill on your cardiac medications before your next appointment, please call your pharmacy*  Lab Work: None Ordered  Testing/Procedures: Your physician has requested that you have an echocardiogram. Echocardiography is a painless test that uses sound waves to create images of your heart. It provides your doctor with information about the size and shape of your heart and how well your heart's chambers and valves are working. This procedure takes approximately one hour. There are no restrictions for this procedure.  Follow-Up: At [facility], you and your health needs are our priority.  As part of our continuing mission to provide you with exceptional heart care, we have created designated Provider Care Teams.  These Care Teams include your primary Cardiologist (physician) and Advanced Practice Providers (APPs -  Physician Assistants and Nurse Practitioners) who all work together to provide you with the care you need, when you need it.  Your next appointment:   1 month(s)  The format for your next appointment:   In Person  Provider:   Shelva Majestic, MD

## 2019-11-04 ENCOUNTER — Encounter: Payer: Self-pay | Admitting: Cardiovascular Disease

## 2019-11-12 ENCOUNTER — Other Ambulatory Visit (HOSPITAL_COMMUNITY): Payer: 59

## 2019-11-13 ENCOUNTER — Other Ambulatory Visit (HOSPITAL_COMMUNITY): Payer: 59

## 2019-12-03 ENCOUNTER — Ambulatory Visit: Payer: 59 | Admitting: Cardiovascular Disease

## 2020-07-03 ENCOUNTER — Other Ambulatory Visit: Payer: 59

## 2020-08-06 ENCOUNTER — Other Ambulatory Visit: Payer: 59

## 2020-08-06 ENCOUNTER — Other Ambulatory Visit: Payer: Self-pay

## 2020-08-06 DIAGNOSIS — Z20822 Contact with and (suspected) exposure to covid-19: Secondary | ICD-10-CM

## 2020-08-07 LAB — SARS-COV-2, NAA 2 DAY TAT

## 2020-08-07 LAB — NOVEL CORONAVIRUS, NAA: SARS-CoV-2, NAA: NOT DETECTED

## 2021-04-05 ENCOUNTER — Other Ambulatory Visit: Payer: Self-pay

## 2021-04-05 ENCOUNTER — Emergency Department (HOSPITAL_COMMUNITY): Payer: No Typology Code available for payment source

## 2021-04-05 ENCOUNTER — Emergency Department (HOSPITAL_COMMUNITY)
Admission: EM | Admit: 2021-04-05 | Discharge: 2021-04-05 | Disposition: A | Discharge status: Home / Self Care | Payer: No Typology Code available for payment source | Attending: Student | Admitting: Student

## 2021-04-05 DIAGNOSIS — Z5321 Procedure and treatment not carried out due to patient leaving prior to being seen by health care provider: Secondary | ICD-10-CM | POA: Diagnosis not present

## 2021-04-05 DIAGNOSIS — S60463A Insect bite (nonvenomous) of left middle finger, initial encounter: Secondary | ICD-10-CM | POA: Insufficient documentation

## 2021-04-05 DIAGNOSIS — W57XXXA Bitten or stung by nonvenomous insect and other nonvenomous arthropods, initial encounter: Secondary | ICD-10-CM | POA: Insufficient documentation

## 2021-04-05 LAB — CBC WITH DIFFERENTIAL/PLATELET
Abs Immature Granulocytes: 0.03 10*3/uL (ref 0.00–0.07)
Basophils Absolute: 0 10*3/uL (ref 0.0–0.1)
Basophils Relative: 0 %
Eosinophils Absolute: 0.3 10*3/uL (ref 0.0–0.5)
Eosinophils Relative: 4 %
HCT: 48 % (ref 39.0–52.0)
Hemoglobin: 15.4 g/dL (ref 13.0–17.0)
Immature Granulocytes: 0 %
Lymphocytes Relative: 22 %
Lymphs Abs: 1.7 10*3/uL (ref 0.7–4.0)
MCH: 29.1 pg (ref 26.0–34.0)
MCHC: 32.1 g/dL (ref 30.0–36.0)
MCV: 90.6 fL (ref 80.0–100.0)
Monocytes Absolute: 0.5 10*3/uL (ref 0.1–1.0)
Monocytes Relative: 7 %
Neutro Abs: 4.9 10*3/uL (ref 1.7–7.7)
Neutrophils Relative %: 67 %
Platelets: 228 10*3/uL (ref 150–400)
RBC: 5.3 MIL/uL (ref 4.22–5.81)
RDW: 14.2 % (ref 11.5–15.5)
WBC: 7.4 10*3/uL (ref 4.0–10.5)
nRBC: 0 % (ref 0.0–0.2)

## 2021-04-05 LAB — BASIC METABOLIC PANEL
Anion gap: 7 (ref 5–15)
BUN: 27 mg/dL — ABNORMAL HIGH (ref 6–20)
CO2: 23 mmol/L (ref 22–32)
Calcium: 9.2 mg/dL (ref 8.9–10.3)
Chloride: 107 mmol/L (ref 98–111)
Creatinine, Ser: 1.19 mg/dL (ref 0.61–1.24)
GFR, Estimated: >60 mL/min (ref >60)
Glucose, Bld: 98 mg/dL (ref 70–99)
Potassium: 4.1 mmol/L (ref 3.5–5.1)
Sodium: 137 mmol/L (ref 135–145)

## 2021-04-05 NOTE — ED Notes (Signed)
Called for repeat VS x5, no response.

## 2021-04-05 NOTE — ED Provider Notes (Signed)
Emergency Medicine Provider Triage Evaluation Note  Anthony Garcia , a 45 y.o. male  was evaluated in triage.  Pt complains of who presents with concern for insect bite to the right middle finger x72 hours, now with redness and swelling extending up to the wrist as well as itchiness in the hand extending up towards the elbow.  History of reaction to bee stings in the past.  Review of Systems  Positive: Redness, pain, swelling in the distal right middle finger extending up to the wrist with associated itchiness Negative: Fevers, chills, nausea, vomiting, numbness, tingling in the  Physical Exam  BP (!) 142/69 (BP Location: Left Arm)   Pulse 71   Temp 99.4 F (37.4 C) (Oral)   Resp 19   SpO2 100%  Gen:   Awake, no distress   Resp:  Normal effort  MSK:   Moves extremities without difficulty  Other:  Tiny wound to the right distal middle finger, erythema and edema extending from the right middle finger up the dorsum of the right hand, and into the right wrist.  No crepitus.  Full range of motion of the wrist but decreased range of motion of the digits due to edema and tightness in the hand  Medical Decision Making  Medically screening exam initiated at 6:20 PM.  Appropriate orders placed.  Loxley Cabello was informed that the remainder of the evaluation will be completed by another provider, this initial triage assessment does not replace that evaluation, and the importance of remaining in the ED until their evaluation is complete.  This chart was dictated using voice recognition software, Dragon. Despite the best efforts of this provider to proofread and correct errors, errors may still occur which can change documentation meaning.    Aura Dials 04/05/21 1827    Arnaldo Natal, MD 04/05/21 2008

## 2021-04-05 NOTE — ED Triage Notes (Signed)
Pt got bite by insect on Friday morning on L middle finger, finger today is swollen, red, and itchy.

## 2021-04-05 NOTE — ED Notes (Signed)
Pt called for vital sign recheck with no response.

## 2021-05-11 ENCOUNTER — Ambulatory Visit
Admission: EM | Admit: 2021-05-11 | Discharge: 2021-05-11 | Disposition: A | Discharge status: Home / Self Care | Payer: No Typology Code available for payment source | Attending: Family Medicine | Admitting: Family Medicine

## 2021-05-11 ENCOUNTER — Other Ambulatory Visit: Payer: Self-pay

## 2021-05-11 DIAGNOSIS — I482 Chronic atrial fibrillation, unspecified: Secondary | ICD-10-CM

## 2021-05-11 DIAGNOSIS — R300 Dysuria: Secondary | ICD-10-CM

## 2021-05-11 LAB — POCT URINALYSIS DIP (MANUAL ENTRY)
Glucose, UA: NEGATIVE mg/dL
Leukocytes, UA: NEGATIVE
Nitrite, UA: NEGATIVE
Protein Ur, POC: NEGATIVE mg/dL
Spec Grav, UA: 1.025 (ref 1.010–1.025)
Urobilinogen, UA: 0.2 E.U./dL
pH, UA: 5.5 (ref 5.0–8.0)

## 2021-05-11 MED ORDER — CEFTRIAXONE SODIUM 500 MG IJ SOLR
500.0000 mg | Freq: Once | INTRAMUSCULAR | Status: AC
  Administered 2021-05-11: 500 mg via INTRAMUSCULAR

## 2021-05-11 NOTE — ED Provider Notes (Signed)
[facility]    CSN: [id number] Arrival date & time: 05/11/21  1731      History   Chief Complaint Chief Complaint  Patient presents with   Dysuria   Irregular Heart Beat    HPI Anthony Garcia is a 45 y.o. male.   Patient presenting today with 1 to 2-day history of urethral irritation, worse after urinating but constant per patient.  Denies rashes, discharge, hematuria, pelvic or abdominal pain, fevers.  Did have intercourse with a new partner about 2 weeks ago.  Has not tried anything over-the-counter for symptoms.  Also states his heart rhythm has been abnormal lately, which is not abnormal but he states that his heart rate will sometimes jump up to 130 briefly and cause some intermittent runs of shortness of breath.  He is currently not having this, most recently about 2 days ago.  Previously followed by cardiology for his atrial fibrillation and at one time on blood thinners and beta-blockers but states he stopped taking these because he does not want to be on any medications.  Currently denies chest pain, shortness of breath, dizziness, syncope.   Past Medical History:  Diagnosis Date   Hyperlipidemia    Hypertension     Patient Active Problem List   Diagnosis Date Noted   Rash 01/17/2018   Hearing loss in left ear 01/17/2018   Multiple benign nevi without atypia- specifically 4*42m R upper outer thigh 02/28/2017   History of episode of anxiety 12/16/2016   Hyperlipidemia 11/08/2016   Hypertension 11/08/2016   H/O knee surgery- multiple L knee sx 11/08/2016   Snores 11/08/2016   Breath holding episodes 11/08/2016   Acute reaction to stress/ Anxiety 11/08/2016   medical Noncompliance 11/08/2016   Tobacco use disorder 11/08/2016   Tobacco abuse counseling 11/08/2016   Palpitations 11/08/2016   Obesity, Class I, BMI 30-34.9 11/08/2016   Family history of early CAD 11/08/2016   Perennial allergic rhinitis 11/08/2016    Past Surgical History:  Procedure  Laterality Date   ANTERIOR CRUCIATE LIGAMENT REPAIR Left    x3    KNEE ARTHROSCOPY Left        Home Medications    Prior to Admission medications   Medication Sig Start Date End Date Taking? Authorizing Provider  metoprolol succinate (TOPROL-XL) 50 MG 24 hr tablet Take 1 tablet (50 mg total) by mouth daily. Take with or immediately following a meal. 10/31/19 01/29/20  KTroy Sine MD  metoprolol tartrate (LOPRESSOR) 25 MG tablet Take 1 tablet by mouth as needed for palpitations. Patient not taking: Reported on 10/31/2019 09/17/19   MAlmyra Deforest PA  metoprolol tartrate (LOPRESSOR) 50 MG tablet Take 2 hours prior to procedure Patient not taking: Reported on 10/31/2019 08/30/19   MAlmyra Deforest PA  OVER THE COUNTER MEDICATION Electrolyte drink    [provider]  OVER THE COUNTER MEDICATION Take 1 capsule by mouth daily. [facility] [place] [facility]   [provider]  rivaroxaban (XARELTO) 20 MG TABS tablet Take 1 tablet (20 mg total) by mouth daily with supper. 10/31/19   KTroy Sine MD  VITAMIN D PO Take by mouth.    [provider]    Family History Family History  Problem Relation Age of Onset   Anemia Mother    Heart attack Father    Hyperlipidemia Father    Hypertension Father    Hyperlipidemia Brother    Hypertension Brother    Atrial fibrillation Brother    Healthy Son  Healthy Son    Healthy Son    Allergic rhinitis Neg Hx    Angioedema Neg Hx    Asthma Neg Hx    Eczema Neg Hx    Immunodeficiency Neg Hx    Urticaria Neg Hx     Social History Social History   Tobacco Use   Smoking status: Former    Packs/day: 0.50    Years: 15.00    Pack years: 7.50    Types: Cigarettes   Smokeless tobacco: Never  Substance Use Topics   Alcohol use: No   Drug use: Yes    Types: Marijuana     Allergies   Patient has no known allergies.   Review of Systems Review of Systems Per HPI  Physical Exam Triage Vital Signs ED Triage Vitals  Enc Vitals  Group     BP 05/11/21 1913 (!) 143/76     Pulse Rate 05/11/21 1913 97     Resp 05/11/21 1913 18     Temp 05/11/21 1913 98.5 F (36.9 C)     Temp Source 05/11/21 1913 Oral     SpO2 05/11/21 1913 98 %     Weight --      Height --      Head Circumference --      Peak Flow --      Pain Score 05/11/21 1914 5     Pain Loc --      Pain Edu? --      Excl. in [place]? --    No data found.  Updated Vital Signs BP (!) 143/76 (BP Location: Left Arm)   Pulse 97   Temp 98.5 F (36.9 C) (Oral)   Resp 18   SpO2 98%   Visual Acuity Right Eye Distance:   Left Eye Distance:   Bilateral Distance:    Right Eye Near:   Left Eye Near:    Bilateral Near:     Physical Exam Vitals and nursing note reviewed.  Constitutional:      Appearance: Normal appearance.  HENT:     Head: Atraumatic.     Mouth/Throat:     Mouth: Mucous membranes are moist.  Eyes:     Extraocular Movements: Extraocular movements intact.     Conjunctiva/sclera: Conjunctivae normal.  Cardiovascular:     Rate and Rhythm: Normal rate. Rhythm irregular.  Pulmonary:     Effort: Pulmonary effort is normal.     Breath sounds: Normal breath sounds.  Abdominal:     General: Bowel sounds are normal. There is no distension.     Palpations: Abdomen is soft.     Tenderness: There is no abdominal tenderness. There is no right CVA tenderness, left CVA tenderness or guarding.  Genitourinary:    Comments: GU exam deferred, self swab performed Musculoskeletal:        General: Normal range of motion.     Cervical back: Normal range of motion and neck supple.  Skin:    General: Skin is warm and dry.  Neurological:     General: No focal deficit present.     Mental Status: He is oriented to person, place, and time.  Psychiatric:        Mood and Affect: Mood normal.        Thought Content: Thought content normal.        Judgment: Judgment normal.     UC Treatments / Results  Labs (all labs ordered are listed, but only abnormal  results are displayed) Labs  Reviewed  POCT URINALYSIS DIP (MANUAL ENTRY) - Abnormal; Notable for the following components:      Result Value   Bilirubin, UA small (*)    Ketones, POC UA moderate (40) (*)    Blood, UA trace-intact (*)    All other components within normal limits  CYTOLOGY, (ORAL, ANAL, URETHRAL) ANCILLARY ONLY    EKG   Radiology No results found.  Procedures Procedures (including critical care time)  Medications Ordered in UC Medications  cefTRIAXone (ROCEPHIN) injection 500 mg (500 mg Intramuscular Given 05/11/21 1957)    Initial Impression / Assessment and Plan / UC Course  I have reviewed the triage vital signs and the nursing notes.  Pertinent labs & imaging results that were available during my care of the patient were reviewed by me and considered in my medical decision making (see chart for details).     UA without evidence of a urinary tract infection, given his recent new partner and his concern for STIs will treat with IM Rocephin while awaiting the cytology swab results.  Adjust as needed based on this.  Discussed abstinence until results return.  EKG performed today given his history of atrial fibrillation and palpitations, intermittent shortness of breath.  Still in atrial fibrillation, no evidence of RVR at this time.  Discussed calling the cardiologist first thing in the morning to follow-up and possibly restart beta-blocker though he is uncomfortable doing so at this time.  Go to the emergency department if having a sustained period of chest pain, shortness of breath or high heart rate.  Final Clinical Impressions(s) / UC Diagnoses   Final diagnoses:  Dysuria  Chronic atrial fibrillation [facility])   Discharge Instructions   None    ED Prescriptions   None    PDMP not reviewed this encounter.   Volney American, [place] 05/11/21 2016

## 2021-05-11 NOTE — ED Triage Notes (Signed)
Pt present urinary frequency with a burning sensation. Symptoms started a week ago. Pt also complains of irregular heartbeat but has a afib.

## 2021-05-13 ENCOUNTER — Telehealth: Payer: Self-pay | Admitting: Cardiovascular Disease

## 2021-05-13 LAB — CYTOLOGY, (ORAL, ANAL, URETHRAL) ANCILLARY ONLY
Chlamydia: NEGATIVE
Comment: NEGATIVE
Comment: NEGATIVE
Comment: NORMAL
Neisseria Gonorrhea: POSITIVE — AB
Trichomonas: NEGATIVE

## 2021-05-13 MED ORDER — METOPROLOL SUCCINATE ER 50 MG PO TB24: 50.0000 mg | ORAL_TABLET | Freq: Every day | ORAL | 0 refills | Status: DC

## 2021-05-13 NOTE — Telephone Encounter (Signed)
*  STAT* If patient is at the pharmacy, call can be transferred to refill team.   1. Which medications need to be refilled? (please list name of each medication and dose if known) metoprolol tartrate (LOPRESSOR) 50 MG tablet  2. Which pharmacy/location (including street and city if local pharmacy) is medication to be sent to?  [id number]- [place], [place] - [address]  3. Do they need a 30 day or 90 day supply? 30ds

## 2021-05-13 NOTE — Telephone Encounter (Signed)
Refills has been sent to the pharmacy. 

## 2021-05-29 ENCOUNTER — Ambulatory Visit: Payer: Self-pay

## 2021-06-07 ENCOUNTER — Encounter: Payer: Self-pay | Admitting: Emergency Medicine

## 2021-06-07 ENCOUNTER — Other Ambulatory Visit (HOSPITAL_COMMUNITY)
Admission: RE | Admit: 2021-06-07 | Discharge: 2021-06-07 | Disposition: A | Discharge status: Home / Self Care | Payer: No Typology Code available for payment source | Source: Ambulatory Visit | Attending: Family Medicine | Admitting: Family Medicine

## 2021-06-07 ENCOUNTER — Other Ambulatory Visit: Payer: Self-pay

## 2021-06-07 ENCOUNTER — Emergency Department
Admission: EM | Admit: 2021-06-07 | Discharge: 2021-06-07 | Disposition: A | Discharge status: Home / Self Care | Payer: No Typology Code available for payment source | Source: Home / Self Care | Attending: Family Medicine | Admitting: Family Medicine

## 2021-06-07 DIAGNOSIS — Z113 Encounter for screening for infections with a predominantly sexual mode of transmission: Secondary | ICD-10-CM | POA: Insufficient documentation

## 2021-06-07 DIAGNOSIS — R300 Dysuria: Secondary | ICD-10-CM | POA: Insufficient documentation

## 2021-06-07 NOTE — ED Provider Notes (Signed)
[facility]    CSN: [id number] Arrival date & time: 06/07/21  1141      History   Chief Complaint Chief Complaint  Patient presents with   SEXUALLY TRANSMITTED DISEASE    HPI Anthony Garcia is a 45 y.o. male.   HPI  Treated on 05/11/21 for gonorrhea with rocephin 500 MG IM and azithromycin.  He went to a fast med for re testing 05/29/21 and his tests were negative He returns today bc he continues to have dysuria.  Every day.  No rash or discharge.  He is convinced that the gonorrhea is not treated.  Hs avoided sexual relations since the original diagnosis in July  Past Medical History:  Diagnosis Date   Hyperlipidemia    Hypertension     Patient Active Problem List   Diagnosis Date Noted   Rash 01/17/2018   Hearing loss in left ear 01/17/2018   Multiple benign nevi without atypia- specifically 4*64m R upper outer thigh 02/28/2017   History of episode of anxiety 12/16/2016   Hyperlipidemia 11/08/2016   Hypertension 11/08/2016   H/O knee surgery- multiple L knee sx 11/08/2016   Snores 11/08/2016   Breath holding episodes 11/08/2016   Acute reaction to stress/ Anxiety 11/08/2016   medical Noncompliance 11/08/2016   Tobacco use disorder 11/08/2016   Tobacco abuse counseling 11/08/2016   Palpitations 11/08/2016   Obesity, Class I, BMI 30-34.9 11/08/2016   Family history of early CAD 11/08/2016   Perennial allergic rhinitis 11/08/2016    Past Surgical History:  Procedure Laterality Date   ANTERIOR CRUCIATE LIGAMENT REPAIR Left    x3    KNEE ARTHROSCOPY Left        Home Medications    Prior to Admission medications   Medication Sig Start Date End Date Taking? Authorizing Provider  metoprolol succinate (TOPROL-XL) 50 MG 24 hr tablet Take 1 tablet (50 mg total) by mouth daily. NEED OV. 05/13/21 06/12/21  KTroy Sine MD  metoprolol tartrate (LOPRESSOR) 25 MG tablet Take 1 tablet by mouth as needed for palpitations. Patient not taking: Reported on  10/31/2019 09/17/19   MAlmyra Deforest PA  metoprolol tartrate (LOPRESSOR) 50 MG tablet Take 2 hours prior to procedure Patient not taking: Reported on 10/31/2019 08/30/19   MAlmyra Deforest PA  OVER THE COUNTER MEDICATION Electrolyte drink    [provider]  OVER THE COUNTER MEDICATION Take 1 capsule by mouth daily. [facility]   [[facility]  rivaroxaban (XARELTO) 20 MG TABS tablet Take 1 tablet (20 mg total) by mouth daily with supper. Patient not taking: Reported on 06/07/2021 10/31/19   KTroy Sine MD  VITAMIN D PO Take by mouth.    [provider]    Family History Family History  Problem Relation Age of Onset   Anemia Mother    Heart attack Father    Hyperlipidemia Father    Hypertension Father    Hyperlipidemia Brother    Hypertension Brother    Atrial fibrillation Brother    Healthy Son    Healthy Son    Healthy Son    Allergic rhinitis Neg Hx    Angioedema Neg Hx    Asthma Neg Hx    Eczema Neg Hx    Immunodeficiency Neg Hx    Urticaria Neg Hx     Social History Social History   Tobacco Use   Smoking status: Former    Packs/day: 0.50    Years: 15.00    Pack  years: 7.50    Types: Cigarettes   Smokeless tobacco: Never  Substance Use Topics   Alcohol use: No   Drug use: Yes    Types: Marijuana     Allergies   Patient has no known allergies.   Review of Systems Review of Systems  See HPI Physical Exam Triage Vital Signs ED Triage Vitals  Enc Vitals Group     BP 06/07/21 1206 130/81     Pulse Rate 06/07/21 1206 85     Resp 06/07/21 1206 15     Temp 06/07/21 1206 98.3 F (36.8 C)     Temp Source 06/07/21 1206 Oral     SpO2 06/07/21 1206 97 %     Weight 06/07/21 1207 200 lb (90.7 kg)     Height --      Head Circumference --      Peak Flow --      Pain Score 06/07/21 1206 0     Pain Loc --      Pain Edu? --      Excl. in [place]? --    No data found.  Updated Vital Signs BP 130/81 (BP Location: Right Arm)   Pulse 85    Temp 98.3 F (36.8 C) (Oral)   Resp 15   Wt 90.7 kg   SpO2 97%   BMI 25.68 kg/m      Physical Exam Constitutional:      General: He is not in acute distress.    Appearance: He is well-developed.  HENT:     Head: Normocephalic and atraumatic.  Eyes:     Conjunctiva/sclera: Conjunctivae normal.     Pupils: Pupils are equal, round, and reactive to light.  Cardiovascular:     Rate and Rhythm: Normal rate.  Pulmonary:     Effort: Pulmonary effort is normal. No respiratory distress.  Abdominal:     General: There is no distension.     Palpations: Abdomen is soft.  Genitourinary:    Comments: Self swab done Musculoskeletal:        General: Normal range of motion.     Cervical back: Normal range of motion.  Skin:    General: Skin is warm and dry.  Neurological:     Mental Status: He is alert.     UC Treatments / Results  Labs (all labs ordered are listed, but only abnormal results are displayed) Labs Reviewed  CYTOLOGY, (ORAL, ANAL, URETHRAL) ANCILLARY ONLY    EKG   Radiology No results found.  Procedures Procedures (including critical care time)  Medications Ordered in UC Medications - No data to display  Initial Impression / Assessment and Plan / UC Course  I have reviewed the triage vital signs and the nursing notes.  Pertinent labs & imaging results that were available during my care of the patient were reviewed by me and considered in my medical decision making (see chart for details).      Will re test based on continues symptoms treat if positive Final Clinical Impressions(s) / UC Diagnoses   Final diagnoses:  Dysuria     Discharge Instructions      Check MyChart for your test result You will be called if any of your results are positive   ED Prescriptions   None    PDMP not reviewed this encounter.   Raylene Everts, MD 06/07/21 [phone]

## 2021-06-07 NOTE — Discharge Instructions (Addendum)
Check MyChart for your test result You will be called if any of your results are positive

## 2021-06-07 NOTE — ED Triage Notes (Signed)
Same symptoms w/ previous STD  Sample at [facility] was negative - per pt it was a urine sample - results were negative  Pt has not had sexual relations since being treated in July C/o burning at tip of penis after voiding No COVID vaccine

## 2021-06-09 LAB — CYTOLOGY, (ORAL, ANAL, URETHRAL) ANCILLARY ONLY
Chlamydia: NEGATIVE
Comment: NEGATIVE
Comment: NEGATIVE
Comment: NORMAL
Neisseria Gonorrhea: NEGATIVE
Trichomonas: NEGATIVE

## 2021-06-17 ENCOUNTER — Telehealth: Payer: Self-pay | Admitting: Cardiovascular Disease

## 2021-06-17 NOTE — Telephone Encounter (Signed)
STAT if HR is under 50 or over 120 (normal HR is 60-100 beats per minute)  What is your heart rate? 120  Do you have a log of your heart rate readings (document readings)?   Do you have any other symptoms?

## 2021-06-17 NOTE — Telephone Encounter (Signed)
Pt calling to let us know he has been out of rhythm for the past month. He has times his HR reaches 120bpm. He has been taking metoprolol tartrate to lower his rate, but last night it didn't work as well and he was bothered by it. This is why he called today. He denies CP, SOB, dizziness, syncope.   I advised pt to keep his appt for tomorrow. He may take his PRN metoprolol tartrate as directed for today. We discussed ED precautions.  He had no additional needs at this time.

## 2021-06-18 ENCOUNTER — Other Ambulatory Visit: Payer: Self-pay

## 2021-06-18 ENCOUNTER — Ambulatory Visit: Payer: No Typology Code available for payment source | Admitting: Cardiovascular Disease

## 2021-06-18 ENCOUNTER — Encounter: Payer: Self-pay | Admitting: Cardiovascular Disease

## 2021-06-18 VITALS — BP 118/82 | HR 89 | Ht 74.0 in | Wt 205.8 lb

## 2021-06-18 DIAGNOSIS — I4819 Other persistent atrial fibrillation: Secondary | ICD-10-CM | POA: Diagnosis not present

## 2021-06-18 DIAGNOSIS — R5383 Other fatigue: Secondary | ICD-10-CM

## 2021-06-18 DIAGNOSIS — I2584 Coronary atherosclerosis due to calcified coronary lesion: Secondary | ICD-10-CM

## 2021-06-18 DIAGNOSIS — R0683 Snoring: Secondary | ICD-10-CM

## 2021-06-18 DIAGNOSIS — H0263 Xanthelasma of right eye, unspecified eyelid: Secondary | ICD-10-CM | POA: Diagnosis not present

## 2021-06-18 DIAGNOSIS — I2510 Atherosclerotic heart disease of native coronary artery without angina pectoris: Secondary | ICD-10-CM | POA: Diagnosis not present

## 2021-06-18 DIAGNOSIS — H0266 Xanthelasma of left eye, unspecified eyelid: Secondary | ICD-10-CM

## 2021-06-18 DIAGNOSIS — I4891 Unspecified atrial fibrillation: Secondary | ICD-10-CM

## 2021-06-18 DIAGNOSIS — E7849 Other hyperlipidemia: Secondary | ICD-10-CM

## 2021-06-18 LAB — COMPREHENSIVE METABOLIC PANEL
ALT: 19 IU/L (ref 0–44)
AST: 18 IU/L (ref 0–40)
Albumin/Globulin Ratio: 2.2 (ref 1.2–2.2)
Albumin: 5.1 g/dL — ABNORMAL HIGH (ref 4.0–5.0)
Alkaline Phosphatase: 29 IU/L — ABNORMAL LOW (ref 44–121)
BUN/Creatinine Ratio: 27 — ABNORMAL HIGH (ref 9–20)
BUN: 29 mg/dL — ABNORMAL HIGH (ref 6–24)
Bilirubin Total: 0.8 mg/dL (ref 0.0–1.2)
CO2: 22 mmol/L (ref 20–29)
Calcium: 10.1 mg/dL (ref 8.7–10.2)
Chloride: 102 mmol/L (ref 96–106)
Creatinine, Ser: 1.08 mg/dL (ref 0.76–1.27)
Globulin, Total: 2.3 g/dL (ref 1.5–4.5)
Glucose: 97 mg/dL (ref 65–99)
Potassium: 4.6 mmol/L (ref 3.5–5.2)
Sodium: 138 mmol/L (ref 134–144)
Total Protein: 7.4 g/dL (ref 6.0–8.5)
eGFR: 87 mL/min/{1.73_m2} (ref >59)

## 2021-06-18 LAB — LIPID PANEL
Chol/HDL Ratio: 6.2 ratio — ABNORMAL HIGH (ref 0.0–5.0)
Cholesterol, Total: 335 mg/dL — ABNORMAL HIGH (ref 100–199)
HDL: 54 mg/dL (ref >39)
LDL Chol Calc (NIH): 270 mg/dL — ABNORMAL HIGH (ref 0–99)
Triglycerides: 76 mg/dL (ref 0–149)
VLDL Cholesterol Cal: 11 mg/dL (ref 5–40)

## 2021-06-18 LAB — CBC
Hematocrit: 54 % — ABNORMAL HIGH (ref 37.5–51.0)
Hemoglobin: 17.5 g/dL (ref 13.0–17.7)
MCH: 28 pg (ref 26.6–33.0)
MCHC: 32.4 g/dL (ref 31.5–35.7)
MCV: 87 fL (ref 79–97)
Platelets: 238 10*3/uL (ref 150–450)
RBC: 6.24 x10E6/uL — ABNORMAL HIGH (ref 4.14–5.80)
RDW: 13.6 % (ref 11.6–15.4)
WBC: 5.1 10*3/uL (ref 3.4–10.8)

## 2021-06-18 LAB — TSH: TSH: 0.552 u[IU]/mL (ref 0.450–4.500)

## 2021-06-18 MED ORDER — METOPROLOL SUCCINATE ER 50 MG PO TB24: 50.0000 mg | ORAL_TABLET | Freq: Every day | ORAL | 3 refills | Status: DC

## 2021-06-18 MED ORDER — RIVAROXABAN 20 MG PO TABS: 20.0000 mg | ORAL_TABLET | Freq: Every day | ORAL | 6 refills | Status: DC

## 2021-06-18 NOTE — Progress Notes (Signed)
Cardiology Office Note    Date:  06/24/2021   ID:  [name], DOB 14-Sep-1976, MRN [id number]  PCP:  Pcp, No  Cardiologist:  Shelva Majestic, MD   20 month F/U  History of Present Illness:  [place] is a 45 y.o. male who was initially referred for cardiology consultation through the request of Dr. Everette Rank for evaluation of palpitations, hyperlipidemia, and difficulty with sleep.  He was last evaluated by me in January 2021.  He presents for follow-up evaluation.   [name] was born in [place].  His father was in [employer] as result, the patient lived in numerous places growing up, but he regards [place] as the place that he called home.  His father, who was very active, died suddenly from a heart attack at age 54.  He had a history of hyperlipidemia.  The patient states that over the past 20 years.  he has smoked off and on and for the past 21 days has been on Chantix and has not been smoking.  Prior to  moving to [place]  he was living in [place].  While in [place], he had some symptoms of vague chest pain or palpitations and apparently saw a cardiologist on one occasion and had an exercise stress echo.  He was placed on medication for hypertension losartan HCT, and also was started on atorvastatin.  When his prescriptions ran out, he had stopped taking the medication.  For the past year, before moving to [place] he had lived in [place] , but did not seek medical attention.  Recently, he has noticed episodes of palpitations.  He denies that he is under significant increased stress.  He has 3 small children.  Because of palpitations, he was seen by Dr. Everette Rank.  The patient states he was told that it was most likely due to anxiety.  He tells me she had wanted to prescribe Zoloft but he refused.  Later that night, he had recurrent symptoms and presented for emergency room evaluation.  No ectopy was detected.  He also had issues with lower extremity swelling.  A venous lower  extremity ultrasound was negative for deep vein thrombosis.  Patient had been started back on losartan HCT 50/12.5 as well as atorvastatin 40 mg.  Baseline laboratory revealed a total cholesterol of 306, triglycerides 210, HDL 38, VLDL 42, and LDL cholesterol at 226.  The patient states over the past several weeks he has dramatically changed his diet, eating very poorly previously.  His palpitations have improved.  He denies significant caffeine use.  However, he still notes an occasional episode where his heart beats fast.  He also has been told that he stops breathing when he is lying on his back asleep.  He was told of snoring loudly.  Recently he has noticed nocturia at least 2-3 times per night.  He admits that he has not been very active.  He had sustained 5 left knee surgeries.    I saw him for initial evaluation on 01/04/2017.  Since that time, he has been on atorvastatin 40 mg.  At that time, I had a long discussion with him concerning recent literature with reference to subclinical atherosclerosis in the importance of aggressive treatment.  His ECG was stable.  I gave him a prescription for metoprolol, tartrate, and he has taken 25 mg on 2 occasions.  His palpitations were better.  He has lost weight.  He has stopped smoking.  He was denied an inpatient sleep test  and was tentatively scheduled for home study.  He believes he is sleeping better with weight loss and stopping smoking.     I saw him in follow-up April 2020 in a telemedicine visit .  Since his initial evaluation with me in 2018 he had changed his lifestyle and diet.  He quit smoking, changed his diet, and had a 50 pound weight loss with weight from 231 down to 180 pounds.  As result he stopped taking all his medications and now just takes over-the-counter supplements.  He was no longer taking his lipid-lowering therapy.  He apparently had repeat lab work in March 2019, 1 year after I had previously seen him.  Of note, off statin therapy his  total cholesterol was 272, and his LDL cholesterol was 209.  Triglycerides were excellent with his dietary adjustment as well as his HDL which was now 45.   He believes he is feeling better off medication.  He denies chest pain PND orthopnea.  He also has undergone some follow-up lab work at another site.  He states his FTI was 2.5, free T3 2.9, TSH 0.591, but he had a slightly increased reverse T3. He never followed up with his home sleep study.  With his weight loss he was sleeping well, not snoring, and denied residual daytime sleepiness.  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  He also had a very strong family history for premature CAD.  In addition when I had initially seen him he had arcus [name] as well as xanthelasmas.  During my evaluation I strongly recommended reinitiation of statin therapy or PCSK9 inhibition with Repatha.  However, he preferred not to initiate therapy.  However he agreed to undergo a calcium score to assess for coronary calcification and if present I discussed I would strongly reinstitute therapy.  He ultimately underwent a calcium score which was increased at 269 and demonstrated age advanced three-vessel coronary calcification most dense in the proximal LAD.  This was 98th percentile for age and sex matched control.  He was evaluated in November 2020 by Almyra Deforest, PA-C and at that time had complaints of palpitations.  His ECG demonstrated atrial fibrillation.  He was advised that he start medication but he was adamant not to take any scheduled medications.  I last saw him on October 31, 2019.  Over the prior several months he had experienced some heart rate irregularity but most of the time his heart rate and rhythm were stable.  He has not taken any medications.  He underwent follow-up lipid studies in November 2020 which again  showed a markedly elevated total cholesterol at 299 and LDL cholesterol at 233, highly suggestive of heterozygous familial hyperlipidemia.  With his dietary adjustments, his triglycerides and HDL are excellent at 71 and 55, respectively.  VLDL is excellent at 11.  During that evaluation, I had an extensive discussion with him and reviewed his CT cardiac scoring, discussed benefits of aggressive statin therapy as well as PCSK9 inhibition.  However despite lengthy discussion he absolutely refused treatment.  I also discussed potential thromboembolic risk associated with persistent atrial fibrillation in particular stroke risk.  I placed a prescription for Xarelto 20 mg to take if he ultimately decided to initiate therapy as well as metoprolol succinate 25 mg for the first week with increase to 50 mg if no significant rate reduction occurred.  I also recommended coronary CTA  for further assessment of his multivessel coronary calcification.  With his atrial fibrillation I also recommended an echo Doppler study.  Apparently, since my evaluation in January 2022, he never pursued any of the recommendations.  However recently he has become concerned and that he has noticed increasing heart rate and palpitations.  With his increased heart rate has been increasing shortness of breath and at times he has noticed some rare chest sensation.  Because of recent increase concern he now presents for cardiology reevaluation.  He has not been taking any medication and he never started anticoagulation with his atrial fibrillation.  Past Medical History:  Diagnosis Date   Hyperlipidemia    Hypertension     Past Surgical History:  Procedure Laterality Date   ANTERIOR CRUCIATE LIGAMENT REPAIR Left    x3    KNEE ARTHROSCOPY Left     Current Medications: Outpatient Medications Prior to Visit  Medication Sig Dispense Refill   VITAMIN D PO Take by mouth.     metoprolol tartrate (LOPRESSOR) 25 MG tablet Take 1 tablet by  mouth as needed for palpitations. 30 tablet 1   metoprolol tartrate (LOPRESSOR) 50 MG tablet Take 2 hours prior to procedure 1 tablet 0   metoprolol succinate (TOPROL-XL) 50 MG 24 hr tablet Take 1 tablet (50 mg total) by mouth daily. NEED OV. 30 tablet 0   OVER THE COUNTER MEDICATION Electrolyte drink (Patient not taking: Reported on 06/18/2021)     OVER THE COUNTER MEDICATION Take 1 capsule by mouth daily. [place]     rivaroxaban (XARELTO) 20 MG TABS tablet Take 1 tablet (20 mg total) by mouth daily with supper. (Patient not taking: No sig reported) 30 tablet 6   No facility-administered medications prior to visit.     Allergies:   Patient has no known allergies.   Social History   Socioeconomic History   Marital status: Single    Spouse name: [name]    Number of children: 3   Years of education: Not on file   Highest education level: 12th grade  Occupational History   Occupation: [employer]  Tobacco Use   Smoking status: Former    Packs/day: 0.50    Years: 15.00    Pack years: 7.50    Types: Cigarettes   Smokeless tobacco: Never  Substance and Sexual Activity   Alcohol use: No   Drug use: Yes    Types: Marijuana   Sexual activity: Yes  Other Topics Concern   Not on file  Social History Narrative   Right handed    Caffeine 1 - 2 cups daily     Lives at home with wife/children    Social Determinants of Health   Financial Resource Strain: Not on file  Food Insecurity: Not on file  Transportation Needs: Not on file  Physical Activity: Not on file  Stress: Not on file  Social Connections: Not on file    He is married and has 3 children ages 105, 85 and 38.  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  Recently had done high intensity training but has limited this with his increased heart palpitations  Family History:  The patient's family history includes Anemia in his mother; Atrial fibrillation in his brother; Healthy in his son,  son, and son; Heart attack in his father; Hyperlipidemia in his brother and father; Hypertension in his brother and father.   His father died at age 60 with  a myocardial infarction.  His mother is 39.  He has a brother who also has probable familial hyperlipidemia.  ROS General: Negative; No fevers, chills, or night sweats;  HEENT: Negative; No changes in vision or hearing, sinus congestion, difficulty swallowing Pulmonary: Negative; No cough, wheezing, shortness of breath, hemoptysis Cardiovascular:  Occasional palpitations, exertional dyspnea when heart rate increase, vague chest sensation with increased activity. GI: Negative; No nausea, vomiting, diarrhea, or abdominal pain GU: Recently treated for gonorrhea with Rocephin and azithromycin July 2022 Musculoskeletal: Negative; no myalgias, joint pain, or weakness Hematologic/Oncology: Negative; no easy bruising, bleeding Endocrine: Negative; no heat/cold intolerance; no diabetes Neuro: Negative; no changes in balance, headaches Skin: Negative; No rashes or skin lesions Psychiatric: Negative; No behavioral problems, depression Sleep: Negative; No snoring, daytime sleepiness, hypersomnolence, bruxism, restless legs, hypnogognic hallucinations, no cataplexy Other comprehensive 14 point system review is negative.   PHYSICAL EXAM:   VS:  BP 118/82 (BP Location: Left Arm)   Pulse 89   Ht 6' 2"  (1.88 m)   Wt 205 lb 12.8 oz (93.4 kg)   SpO2 97%   BMI 26.42 kg/m     Repeat blood pressure by me was 118/78  Wt Readings from Last 3 Encounters:  06/18/21 205 lb 12.8 oz (93.4 kg)  06/07/21 200 lb (90.7 kg)  10/31/19 194 lb 3.2 oz (88.1 kg)     General: Alert, oriented, no distress.  Skin: normal turgor, no rashes, warm and dry HEENT: Normocephalic, atraumatic. Pupils equal round and reactive to light; sclera anicteric; extraocular muscles intact; bilateral xanthelasmas Nose without nasal septal hypertrophy Mouth/Parynx benign;  Mallinpatti scale 3 Neck: No JVD, no carotid bruits; normal carotid upstroke Lungs: clear to ausculatation and percussion; no wheezing or rales Chest wall: without tenderness to palpitation Heart: PMI not displaced, RRR, s1 s2 normal, 1/6 systolic murmur, no diastolic murmur, no rubs, gallops, thrills, or heaves Abdomen: soft, nontender; no hepatosplenomehaly, BS+; abdominal aorta nontender and not dilated by palpation. Back: no CVA tenderness Pulses 2+ Musculoskeletal: full range of motion, normal strength, no joint deformities Extremities: no clubbing cyanosis or edema, Homan's sign negative  Neurologic: grossly nonfocal; Cranial nerves grossly wnl Psychologic: Normal mood and affect    Studies/Labs Reviewed:   EKG:  EKG is ordered today.  ECG (independently read by me):  Atrila fibrillation at 89  January 2021 ECG (independently read by me): Atrial fibrillation with a ventricular rate at 90 bpm, isolated PVC.  Recent Labs: BMP Latest Ref Rng & Units 06/18/2021 04/05/2021 08/27/2019  Glucose 65 - 99 mg/dL 97 98 88  BUN 6 - 24 mg/dL 29(H) 27(H) 20  Creatinine 0.76 - 1.27 mg/dL 1.08 1.19 0.97  BUN/Creat Ratio 9 - 20 27(H) - 21(H)  Sodium 134 - 144 mmol/L 138 137 138  Potassium 3.5 - 5.2 mmol/L 4.6 4.1 4.4  Chloride 96 - 106 mmol/L 102 107 102  CO2 20 - 29 mmol/L 22 23 23   Calcium 8.7 - 10.2 mg/dL 10.1 9.2 8.9     Hepatic Function Latest Ref Rng & Units 06/18/2021 08/27/2019 10/24/2018  Total Protein 6.0 - 8.5 g/dL 7.4 7.0 7.3  Albumin 4.0 - 5.0 g/dL 5.1(H) 4.4 4.6  AST 0 - 40 IU/L 18 14 12   ALT 0 - 44 IU/L 19 14 10   Alk Phosphatase 44 - 121 IU/L 29(L) 33(L) 31(L)  Total Bilirubin 0.0 - 1.2 mg/dL 0.8 0.8 2.4(H)    CBC Latest Ref Rng & Units 06/18/2021 04/05/2021 08/27/2019  WBC 3.4 - 10.8  x10E3/uL 5.1 7.4 5.5  Hemoglobin 13.0 - 17.7 g/dL 17.5 15.4 15.7  Hematocrit 37.5 - 51.0 % 54.0(H) 48.0 48.7  Platelets 150 - 450 x10E3/uL 238 228 215   Lab Results  Component Value Date   MCV  87 06/18/2021   MCV 90.6 04/05/2021   MCV 89 08/27/2019   Lab Results  Component Value Date   TSH 0.552 06/18/2021   Lab Results  Component Value Date   HGBA1C 5.9 (H) 11/11/2016     BNP No results found for: BNP  ProBNP No results found for: PROBNP   Lipid Panel     Component Value Date/Time   CHOL 335 (H) 06/18/2021 1128   TRIG 76 06/18/2021 1128   HDL 54 06/18/2021 1128   CHOLHDL 6.2 (H) 06/18/2021 1128   CHOLHDL 3.3 02/28/2017 0848   VLDL 15 02/28/2017 0848   LDLCALC 270 (H) 06/18/2021 1128   LABVLDL 11 06/18/2021 1128     RADIOLOGY: No results found.   Additional studies/ records that were reviewed today include:    CT Cardiac Scoring  FINDINGS: Non-cardiac: See separate report from [facility].   Ascending aorta: Normal diameter 3.2 cm   Pericardium: Normal   Coronary arteries: Age advance 3 vessel coronary calcification most dense in the proximal LAD   IMPRESSION: Coronary calcium score of 269. This was 107 th percentile for age and sex matched control.    ASSESSMENT:    1. Persistent atrial fibrillation ([place])   2. Familial hyperlipidemia   3. Coronary artery calcification   4. Xanthelasma of eyelid, bilateral   5. Snoring   6. Fatigue, unspecified type     PLAN:  Mr. 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  Since 2018 he had major lifestyle changes and has lost in excess of 50 pounds, quit smoking, and essentially stopped all his medications.  Remotely, he had been on statin therapy and in May 2018 and LDL cholesterol was normal at 60.  He has not been on any lipid-lowering therapy in several years.  Despite his major lifestyle adjustments, he continues to have significant total cholesterol and LDL cholesterol elevation consistent with heterozygous familial hyperlipidemia.  There is a  strong family history for premature CAD in his father died suddenly with a heart attack at age 60.  When I last saw him in January 2021 spent considerable time with him discussing the importance of initiation of therapy.  On his cardiac CT he already had evidence for multivessel coronary calcification.  At that time, I discussed the importance of treatment and with his high likelihood for familial hyperlipidemia would benefit most likely from aggressive statin therapy with PCSK9 inhibition.  In addition, at the time he was in atrial fibrillation and ultimately he refused initiation of anticoagulation despite awareness of potential stroke risk.  Recently, he has noticed increasing heart palpitations.  When he went on vacation his palpitations seem to increase and he had more heart rate irregularity.  He also began to notice some exertional dyspnea particularly with his increased heart rate with rare chest pain sensation.  The concerns, he arrange for reevaluation and was scheduled to see me today.  Presently, his blood pressure is well controlled.  His ECG confirms that he is in longstanding persistent atrial fibrillation and is not on anticoagulation therapy.  I have recommended he undergo a 2D echo Doppler study for assessment  of LV systolic and diastolic function.  After much discussion he agrees to undergo coronary CTA for assessment of luminal obstruction with reference to his multivessel coronary calcification.  I am scheduling him to undergo fasting comprehensive metabolic panel, CBC, TSH and lipid studies.  Anticipate most likely his LDL cholesterol will continue to be significantly elevated in the past were in the 200s consistent with a familial hyperlipidemic pattern.  With his increased palpitations and atrial fibrillation I have suggested metoprolol succinate initiation to milligrams.  He has a prescription for metoprolol tartrate if needed for as needed increasing episodes.  I also discussed with him my  concern for sleep apnea particularly with his atrial fibrillation have recommended a home study for initial assessment.   Time spent: 45 minutes  Medication Adjustments/Labs and Tests Ordered: Current medicines are reviewed at length with the patient today.  Concerns regarding medicines are outlined above.  Medication changes, Labs and Tests ordered today are listed in the Patient Instructions below. Patient Instructions  Medication Instructions:  Take Metoprolol Tartrate that you have at home (2 hours before your CT scan when scheduled)   *If you need a refill on your cardiac medications before your next appointment, please call your pharmacy*   Lab Work: Fasting lab work (CBC, CMET, TSH, LIPID)  If you have labs (blood work) drawn today and your tests are completely normal, you will receive your results only by: [place] (if you have MyChart) OR A paper copy in the mail If you have any lab test that is abnormal or we need to change your treatment, we will call you to review the results.   Testing/Procedures: Echocardiogram - Your physician has requested that you have an echocardiogram. Echocardiography is a painless test that uses sound waves to create images of your heart. It provides your doctor with information about the size and shape of your heart and how well your heart's chambers and valves are working. This procedure takes approximately one hour. There are no restrictions for this procedure. This will be performed at our [facility] location - [address].  Your physician has requested that you have cardiac CT. Cardiac computed tomography (CT) is a painless test that uses an x-ray machine to take clear, detailed pictures of your heart. For further information please visit [email or web address].[email or web address]. Please follow instruction sheet as given.    Follow-Up: At [facility], you and your health needs are our priority.  As part of our continuing mission to provide  you with exceptional heart care, we have created designated Provider Care Teams.  These Care Teams include your primary Cardiologist (physician) and Advanced Practice Providers (APPs -  Physician Assistants and Nurse Practitioners) who all work together to provide you with the care you need, when you need it.  We recommend signing up for the patient portal called "MyChart".  Sign up information is provided on this After Visit Summary.  MyChart is used to connect with patients for Virtual Visits (Telemedicine).  Patients are able to view lab/test results, encounter notes, upcoming appointments, etc.  Non-urgent messages can be sent to your provider as well.   To learn more about what you can do with MyChart, go to [email or web address].    Your next appointment:   October 10 at 11:30 AM.  The format for your next appointment:   In Person  Provider:   Shelva Majestic, MD   Other Instructions   Your cardiac CT will be scheduled at one of the  below locations:   [facility] [address] [place], [place] [address] [phone]  If scheduled at [facility], please arrive at the [facility] main entrance (entrance A) of [facility] 30 minutes prior to test start time. Proceed to the [facility] Radiology Department (first floor) to check-in and test prep.   Please follow these instructions carefully (unless otherwise directed):  Hold all erectile dysfunction medications at least 3 days (72 hrs) prior to test.  On the Night Before the Test: Be sure to Drink plenty of water. Do not consume any caffeinated/decaffeinated beverages or chocolate 12 hours prior to your test. Do not take any antihistamines 12 hours prior to your test.  On the Day of the Test: Drink plenty of water until 1 hour prior to the test. Do not eat any food 4 hours prior to the test. You may take your regular medications prior to the test.  Take metoprolol (Lopressor) two hours prior to  test. HOLD Furosemide/Hydrochlorothiazide morning of the test.       After the Test: Drink plenty of water. After receiving IV contrast, you may experience a mild flushed feeling. This is normal. On occasion, you may experience a mild rash up to 24 hours after the test. This is not dangerous. If this occurs, you can take Benadryl 25 mg and increase your fluid intake. If you experience trouble breathing, this can be serious. If it is severe call 911 IMMEDIATELY. If it is mild, please call our office. If you take any of these medications: Glipizide/Metformin, Avandament, Glucavance, please do not take 48 hours after completing test unless otherwise instructed.  Please allow 2-4 weeks for scheduling of routine cardiac CTs. Some insurance companies require a pre-authorization which may delay scheduling of this test.   For non-scheduling related questions, please contact the cardiac imaging nurse navigator should you have any questions/concerns: Marchia Bond, Cardiac Imaging Nurse Navigator Gordy Clement, Cardiac Imaging Nurse Navigator [facility] Direct Office Dial: [phone]   For scheduling needs, including cancellations and rescheduling, please call [place], [phone].    Signed, Shelva Majestic, MD  06/24/2021 5:20 PM    [facility] [address], [place], [place], [place]  [address] Phone: [phone]

## 2021-06-18 NOTE — Patient Instructions (Addendum)
Medication Instructions:  Take Metoprolol Tartrate that you have at home (2 hours before your CT scan when scheduled)   *If you need a refill on your cardiac medications before your next appointment, please call your pharmacy*   Lab Work: Fasting lab work (CBC, CMET, TSH, LIPID)  If you have labs (blood work) drawn today and your tests are completely normal, you will receive your results only by: [place] (if you have MyChart) OR A paper copy in the mail If you have any lab test that is abnormal or we need to change your treatment, we will call you to review the results.   Testing/Procedures: Echocardiogram - Your physician has requested that you have an echocardiogram. Echocardiography is a painless test that uses sound waves to create images of your heart. It provides your doctor with information about the size and shape of your heart and how well your heart's chambers and valves are working. This procedure takes approximately one hour. There are no restrictions for this procedure. This will be performed at our [facility] location - [address].  Your physician has requested that you have cardiac CT. Cardiac computed tomography (CT) is a painless test that uses an x-ray machine to take clear, detailed pictures of your heart. For further information please visit [email or web address]. Please follow instruction sheet as given.    Follow-Up: At [facility], you and your health needs are our priority.  As part of our continuing mission to provide you with exceptional heart care, we have created designated Provider Care Teams.  These Care Teams include your primary Cardiologist (physician) and Advanced Practice Providers (APPs -  Physician Assistants and Nurse Practitioners) who all work together to provide you with the care you need, when you need it.  We recommend signing up for the patient portal called "MyChart".  Sign up information is provided on this After Visit  Summary.  MyChart is used to connect with patients for Virtual Visits (Telemedicine).  Patients are able to view lab/test results, encounter notes, upcoming appointments, etc.  Non-urgent messages can be sent to your provider as well.   To learn more about what you can do with MyChart, go to [email or web address].    Your next appointment:   October 10 at 11:30 AM.  The format for your next appointment:   In Person  Provider:   Shelva Majestic, MD   Other Instructions   Your cardiac CT will be scheduled at one of the below locations:   [facility] [address] [place], [place] [address] [phone]  If scheduled at [facility], please arrive at the [facility] main entrance (entrance A) of [facility] 30 minutes prior to test start time. Proceed to the [facility] Radiology Department (first floor) to check-in and test prep.   Please follow these instructions carefully (unless otherwise directed):  Hold all erectile dysfunction medications at least 3 days (72 hrs) prior to test.  On the Night Before the Test: Be sure to Drink plenty of water. Do not consume any caffeinated/decaffeinated beverages or chocolate 12 hours prior to your test. Do not take any antihistamines 12 hours prior to your test.  On the Day of the Test: Drink plenty of water until 1 hour prior to the test. Do not eat any food 4 hours prior to the test. You may take your regular medications prior to the test.  Take metoprolol (Lopressor) two hours prior to test. HOLD Furosemide/Hydrochlorothiazide morning  of the test.       After the Test: Drink plenty of water. After receiving IV contrast, you may experience a mild flushed feeling. This is normal. On occasion, you may experience a mild rash up to 24 hours after the test. This is not dangerous. If this occurs, you can take Benadryl 25 mg and increase your fluid intake. If you experience trouble breathing, this can be  serious. If it is severe call 911 IMMEDIATELY. If it is mild, please call our office. If you take any of these medications: Glipizide/Metformin, Avandament, Glucavance, please do not take 48 hours after completing test unless otherwise instructed.  Please allow 2-4 weeks for scheduling of routine cardiac CTs. Some insurance companies require a pre-authorization which may delay scheduling of this test.   For non-scheduling related questions, please contact the cardiac imaging nurse navigator should you have any questions/concerns: Marchia Bond, Cardiac Imaging Nurse Navigator Gordy Clement, Cardiac Imaging Nurse Navigator [facility] Direct Office Dial: [phone]   For scheduling needs, including cancellations and rescheduling, please call [place], [phone].

## 2021-06-24 ENCOUNTER — Encounter: Payer: Self-pay | Admitting: Cardiovascular Disease

## 2021-06-24 ENCOUNTER — Other Ambulatory Visit (HOSPITAL_COMMUNITY): Payer: Self-pay | Admitting: Emergency Medicine

## 2021-06-24 MED ORDER — METOPROLOL TARTRATE 50 MG PO TABS: ORAL_TABLET | ORAL | 0 refills | Status: DC

## 2021-06-24 NOTE — Telephone Encounter (Signed)
Reaching out to patient to offer assistance regarding upcoming cardiac imaging study; pt verbalizes understanding of appt date/time, parking situation and where to check in, pre-test NPO status and medications ordered, and verified current allergies; name and call back number provided for further questions should they arise Marchia Bond RN Navigator Cardiac Imaging [facility] [phone] office [phone] cell  Informed patient that we might not be able to perform his CCTA if he arrives in afib. I explained that we need a regular heart rhythm to get the best image quality. He said hes in afib most of the time and does not take his meds as prescribed.   I politely suggested that he take his meds as prescribed (metop succ) so that we have the best chance of him being in NSR for CCTA on Friday.  He will take '50mg'$  metop succ + '50mg'$  metop tart on Friday morning for CCTA.  Pt understands that if he arrives with afib, he will get turned away and test will be cancelled.

## 2021-06-26 ENCOUNTER — Encounter (HOSPITAL_COMMUNITY): Payer: Self-pay

## 2021-06-26 ENCOUNTER — Ambulatory Visit (HOSPITAL_COMMUNITY)
Admission: RE | Admit: 2021-06-26 | Discharge: 2021-06-26 | Disposition: A | Discharge status: Home / Self Care | Payer: No Typology Code available for payment source | Source: Ambulatory Visit | Attending: Cardiovascular Disease | Admitting: Cardiovascular Disease

## 2021-06-26 ENCOUNTER — Other Ambulatory Visit: Payer: Self-pay

## 2021-06-26 DIAGNOSIS — I4819 Other persistent atrial fibrillation: Secondary | ICD-10-CM

## 2021-06-26 MED ORDER — NITROGLYCERIN 0.4 MG SL SUBL: 0.8000 mg | SUBLINGUAL_TABLET | Freq: Once | SUBLINGUAL | Status: DC

## 2021-06-26 NOTE — Progress Notes (Signed)
Scan aborted due to patient being is A-fib rate of 90s

## 2021-07-07 ENCOUNTER — Ambulatory Visit (HOSPITAL_COMMUNITY): Payer: No Typology Code available for payment source | Attending: Cardiology

## 2021-07-07 ENCOUNTER — Other Ambulatory Visit: Payer: Self-pay

## 2021-07-07 DIAGNOSIS — I4819 Other persistent atrial fibrillation: Secondary | ICD-10-CM | POA: Diagnosis present

## 2021-07-07 LAB — ECHOCARDIOGRAM COMPLETE: S' Lateral: 3.7 cm

## 2021-07-27 ENCOUNTER — Ambulatory Visit: Payer: No Typology Code available for payment source | Admitting: Cardiovascular Disease

## 2021-07-27 ENCOUNTER — Encounter: Payer: Self-pay | Admitting: Cardiovascular Disease

## 2021-07-27 ENCOUNTER — Other Ambulatory Visit: Payer: Self-pay

## 2021-07-27 VITALS — BP 124/72 | HR 92 | Ht 74.0 in | Wt 202.6 lb

## 2021-07-27 DIAGNOSIS — I4819 Other persistent atrial fibrillation: Secondary | ICD-10-CM

## 2021-07-27 DIAGNOSIS — H0263 Xanthelasma of right eye, unspecified eyelid: Secondary | ICD-10-CM

## 2021-07-27 DIAGNOSIS — R0683 Snoring: Secondary | ICD-10-CM

## 2021-07-27 DIAGNOSIS — H18413 Arcus senilis, bilateral: Secondary | ICD-10-CM

## 2021-07-27 DIAGNOSIS — E7849 Other hyperlipidemia: Secondary | ICD-10-CM | POA: Diagnosis not present

## 2021-07-27 DIAGNOSIS — H0266 Xanthelasma of left eye, unspecified eyelid: Secondary | ICD-10-CM

## 2021-07-27 NOTE — Progress Notes (Signed)
Cardiology Office Note    Date:  08/03/2021   ID:  [name], DOB 1975-12-22, MRN [id number]  PCP:  Pcp, No  Cardiologist:  Shelva Majestic, MD   6 week F/U  History of Present Illness:  Sadarius Norman is a 45 y.o. male who was initially referred for cardiology consultation through the request of Dr. Everette Rank for evaluation of palpitations, hyperlipidemia, and difficulty with sleep.  He was  evaluated by me in January 2021 and represented June 18, 2021.  He presents for follow-up evaluation.   Mr. Rawl was born in [place].  His father was in [employer] as result, the patient lived in numerous places growing up, but he regards [place] as the place that he called home.  His father, who was very active, died suddenly from a heart attack at age 84.  He had a history of hyperlipidemia.  The patient states that over the past 20 years.  he has smoked off and on and for the past 21 days has been on Chantix and has not been smoking.  Prior to  moving to [place]  he was living in [place].  While in [place], he had some symptoms of vague chest pain or palpitations and apparently saw a cardiologist on one occasion and had an exercise stress echo.  He was placed on medication for hypertension losartan HCT, and also was started on atorvastatin.  When his prescriptions ran out, he had stopped taking the medication.  For the past year, before moving to [place] he had lived in [place] , but did not seek medical attention.  Recently, he has noticed episodes of palpitations.  He denies that he is under significant increased stress.  He has 3 small children.  Because of palpitations, he was seen by Dr. Everette Rank.  The patient states he was told that it was most likely due to anxiety.  He tells me she had wanted to prescribe Zoloft but he refused.  Later that night, he had recurrent symptoms and presented for emergency room evaluation.  No ectopy was detected.  He also had issues with lower extremity  swelling.  A venous lower extremity ultrasound was negative for deep vein thrombosis.  Patient had been started back on losartan HCT 50/12.5 as well as atorvastatin 40 mg.  Baseline laboratory revealed a total cholesterol of 306, triglycerides 210, HDL 38, VLDL 42, and LDL cholesterol at 226.  The patient states over the past several weeks he has dramatically changed his diet, eating very poorly previously.  His palpitations have improved.  He denies significant caffeine use.  However, he still notes an occasional episode where his heart beats fast.  He also has been told that he stops breathing when he is lying on his back asleep.  He was told of snoring loudly.  Recently he has noticed nocturia at least 2-3 times per night.  He admits that he has not been very active.  He had sustained 5 left knee surgeries.    I saw him for initial evaluation on 01/04/2017.  Since that time, he has been on atorvastatin 40 mg.  At that time, I had a long discussion with him concerning recent literature with reference to subclinical atherosclerosis in the importance of aggressive treatment.  His ECG was stable.  I gave him a prescription for metoprolol, tartrate, and he has taken 25 mg on 2 occasions.  His palpitations were better.  He has lost weight.  He has stopped smoking.  He was  denied an inpatient sleep test and was tentatively scheduled for home study.  He believes he is sleeping better with weight loss and stopping smoking.     I saw him in follow-up April 2020 in a telemedicine visit .  Since his initial evaluation with me in 2018 he had changed his lifestyle and diet.  He quit smoking, changed his diet, and had a 50 pound weight loss with weight from 231 down to 180 pounds.  As result he stopped taking all his medications and now just takes over-the-counter supplements.  He was no longer taking his lipid-lowering therapy.  He apparently had repeat lab work in March 2019, 1 year after I had previously seen him.  Of  note, off statin therapy his total cholesterol was 272, and his LDL cholesterol was 209.  Triglycerides were excellent with his dietary adjustment as well as his HDL which was now 45.   He believes he is feeling better off medication.  He denies chest pain PND orthopnea.  He also has undergone some follow-up lab work at another site.  He states his FTI was 2.5, free T3 2.9, TSH 0.591, but he had a slightly increased reverse T3. He never followed up with his home sleep study.  With his weight loss he was sleeping well, not snoring, and denied residual daytime sleepiness.  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  He also had a very strong family history for premature CAD.  In addition when I had initially seen him he had arcus [name] as well as xanthelasmas.  During my evaluation I strongly recommended reinitiation of statin therapy or PCSK9 inhibition with Repatha.  However, he preferred not to initiate therapy.  However he agreed to undergo a calcium score to assess for coronary calcification and if present I discussed I would strongly reinstitute therapy.  He ultimately underwent a calcium score which was increased at 269 and demonstrated age advanced three-vessel coronary calcification most dense in the proximal LAD.  This was 98th percentile for age and sex matched control.  He was evaluated in November 2020 by Almyra Deforest, PA-C and at that time had complaints of palpitations.  His ECG demonstrated atrial fibrillation.  He was advised that he start medication but he was adamant not to take any scheduled medications.  I saw him on October 31, 2019.  Over the prior several months he had experienced some heart rate irregularity but most of the time his heart rate and rhythm were stable.  He has not taken any medications.  He underwent follow-up lipid studies in November 2020  which again showed a markedly elevated total cholesterol at 299 and LDL cholesterol at 233, highly suggestive of heterozygous familial hyperlipidemia.  With his dietary adjustments, his triglycerides and HDL are excellent at 71 and 55, respectively.  VLDL is excellent at 11.  During that evaluation, I had an extensive discussion with him and reviewed his CT cardiac scoring, discussed benefits of aggressive statin therapy as well as PCSK9 inhibition.  However despite lengthy discussion he absolutely refused treatment.  I also discussed potential thromboembolic risk associated with persistent atrial fibrillation in particular stroke risk.  I placed a prescription for Xarelto 20 mg to take if he ultimately decided to initiate therapy as well as metoprolol succinate 25 mg for the first week with increase to 50 mg if no significant rate reduction occurred.  I also  recommended coronary CTA for further assessment of his multivessel coronary calcification.  With his atrial fibrillation I also recommended an echo Doppler study.  I saw him on June 18, 2021.  Apparently, since my evaluation in January 2022, he never pursued any of the recommendations.  However recently he has become concerned and that he has noticed increasing heart rate and palpitations.  With his increased heart rate has been increasing shortness of breath and at times he has noticed some rare chest sensation.  Because of recent increase concern he presented for cardiology reevaluation.  He has not been taking any medication and he never started anticoagulation with his atrial fibrillation.  During that evaluation, his ECG demonstrated longstanding persistent atrial fibrillation.  He continued not to be on anticoagulation and I discussed with him at length the importance of anticoagulation to reduce potential stroke risk.  It was recommended he initiate Eliquis 5 mg twice a day.  I recommended he undergo a 2D echo Doppler study for assessment of LV  systolic and diastolic function.  After much discussion he ultimately agreed to undergo coronary CTA for assessment of luminal obstruction and further evaluation of his multivessel coronary calcification.  I also scheduled him for fasting comprehensive laboratory.  With his increased palpitations and atrial fibrillation I suggested initiation of metoprolol succinate 25 mg initially then 50 mg daily.  He underwent an echo Doppler study on July 07, 2021 which showed an EF in the low normal range at 50 to 55%.  There were no regional wall motion abnormalities.  There was mild left atrial dilatation and mild mitral regurgitation.  Apparently, Mr. Corbit never initiated anticoagulation.  He started to take metoprolol succinate but did not believe this was helping him and he and as result he stopped taking this.  When he went for his coronary CTA his heart rate was too fast and as result the procedure was not done.  Presently, he continues to feel that he does not like to take medication.  His heart rate has not been as fast as it had been previously.  He presents for reassessment.  Past Medical History:  Diagnosis Date   Hyperlipidemia    Hypertension     Past Surgical History:  Procedure Laterality Date   ANTERIOR CRUCIATE LIGAMENT REPAIR Left    x3    KNEE ARTHROSCOPY Left     Current Medications: Outpatient Medications Prior to Visit  Medication Sig Dispense Refill   metoprolol succinate (TOPROL-XL) 50 MG 24 hr tablet Take 1 tablet (50 mg total) by mouth daily. Take with or immediately following a meal. 90 tablet 3   metoprolol tartrate (LOPRESSOR) 50 MG tablet Take 2 hours prior to procedure 1 tablet 0   rivaroxaban (XARELTO) 20 MG TABS tablet Take 1 tablet (20 mg total) by mouth daily with supper. 30 tablet 6   VITAMIN D PO Take by mouth.     No facility-administered medications prior to visit.     Allergies:   Patient has no known allergies.   Social History   Socioeconomic  History   Marital status: Single    Spouse name: [name]    Number of children: 3   Years of education: Not on file   Highest education level: 12th grade  Occupational History   Occupation: [employer]  Tobacco Use   Smoking status: Former    Packs/day: 0.50    Years: 15.00    Pack years: 7.50    Types: Cigarettes  Smokeless tobacco: Never  Substance and Sexual Activity   Alcohol use: No   Drug use: Yes    Types: Marijuana   Sexual activity: Yes  Other Topics Concern   Not on file  Social History Narrative   Right handed    Caffeine 1 - 2 cups daily     Lives at home with wife/children    Social Determinants of Health   Financial Resource Strain: Not on file  Food Insecurity: Not on file  Transportation Needs: Not on file  Physical Activity: Not on file  Stress: Not on file  Social Connections: Not on file    He is married and has 3 children ages 71, 41 and 26.  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  Recently had done high intensity training but has limited this with his increased heart palpitations  Family History:  The patient's family history includes Anemia in his mother; Atrial fibrillation in his brother; Healthy in his son, son, and son; Heart attack in his father; Hyperlipidemia in his brother and father; Hypertension in his brother and father.   His father died at age 24 with a myocardial infarction.  His mother is 66.  He has a brother who also has probable familial hyperlipidemia.  ROS General: Negative; No fevers, chills, or night sweats;  HEENT: Negative; No changes in vision or hearing, sinus congestion, difficulty swallowing Pulmonary: Negative; No cough, wheezing, shortness of breath, hemoptysis Cardiovascular:  Occasional palpitations, exertional dyspnea when heart rate increase, vague chest sensation with increased activity. GI: Negative; No nausea, vomiting, diarrhea, or abdominal pain GU: Recently treated for  gonorrhea with Rocephin and azithromycin July 2022 Musculoskeletal: Negative; no myalgias, joint pain, or weakness Hematologic/Oncology: Negative; no easy bruising, bleeding Endocrine: Negative; no heat/cold intolerance; no diabetes Neuro: Negative; no changes in balance, headaches Skin: Negative; No rashes or skin lesions Psychiatric: Negative; No behavioral problems, depression Sleep: Negative; No snoring, daytime sleepiness, hypersomnolence, bruxism, restless legs, hypnogognic hallucinations, no cataplexy Other comprehensive 14 point system review is negative.   PHYSICAL EXAM:   VS:  BP 124/72   Pulse 92   Ht 6' 2"  (1.88 m)   Wt 202 lb 9.6 oz (91.9 kg)   SpO2 99%   BMI 26.01 kg/m     Repeat blood pressure by me 122/70  Wt Readings from Last 3 Encounters:  07/27/21 202 lb 9.6 oz (91.9 kg)  06/18/21 205 lb 12.8 oz (93.4 kg)  06/07/21 200 lb (90.7 kg)    General: Alert, oriented, no distress.  Skin: normal turgor, no rashes, warm and dry HEENT: Normocephalic, atraumatic. Pupils equal round and reactive to light; sclera anicteric; extraocular muscles intact; bilateral xanthelasmas Nose without nasal septal hypertrophy Mouth/Parynx benign; Mallinpatti scale 2 Neck: No JVD, no carotid bruits; normal carotid upstroke Lungs: clear to ausculatation and percussion; no wheezing or rales Chest wall: without tenderness to palpitation Heart: PMI not displaced, irregular irregular rhythm consistent with atrial fibrillation rate in the 90s, s1 s2 normal, 1/6 systolic murmur, no diastolic murmur, no rubs, gallops, thrills, or heaves Abdomen: soft, nontender; no hepatosplenomehaly, BS+; abdominal aorta nontender and not dilated by palpation. Back: no CVA tenderness Pulses 2+ Musculoskeletal: full range of motion, normal strength, no joint deformities Extremities: no clubbing cyanosis or edema, Homan's sign negative  Neurologic: grossly nonfocal; Cranial nerves grossly wnl Psychologic:  Normal mood and affect   Studies/Labs Reviewed:   July 27, 2021 ECG (independently read by me):  Atrial fibrillation at 94, nonspecific T changes  June 18, 2021 ECG (independently read by me):  Atrila fibrillation at 89  January 2021 ECG (independently read by me): Atrial fibrillation with a ventricular rate at 90 bpm, isolated PVC.  Recent Labs: BMP Latest Ref Rng & Units 06/18/2021 04/05/2021 08/27/2019  Glucose 65 - 99 mg/dL 97 98 88  BUN 6 - 24 mg/dL 29(H) 27(H) 20  Creatinine 0.76 - 1.27 mg/dL 1.08 1.19 0.97  BUN/Creat Ratio 9 - 20 27(H) - 21(H)  Sodium 134 - 144 mmol/L 138 137 138  Potassium 3.5 - 5.2 mmol/L 4.6 4.1 4.4  Chloride 96 - 106 mmol/L 102 107 102  CO2 20 - 29 mmol/L 22 23 23   Calcium 8.7 - 10.2 mg/dL 10.1 9.2 8.9     Hepatic Function Latest Ref Rng & Units 06/18/2021 08/27/2019 10/24/2018  Total Protein 6.0 - 8.5 g/dL 7.4 7.0 7.3  Albumin 4.0 - 5.0 g/dL 5.1(H) 4.4 4.6  AST 0 - 40 IU/L 18 14 12   ALT 0 - 44 IU/L 19 14 10   Alk Phosphatase 44 - 121 IU/L 29(L) 33(L) 31(L)  Total Bilirubin 0.0 - 1.2 mg/dL 0.8 0.8 2.4(H)    CBC Latest Ref Rng & Units 06/18/2021 04/05/2021 08/27/2019  WBC 3.4 - 10.8 x10E3/uL 5.1 7.4 5.5  Hemoglobin 13.0 - 17.7 g/dL 17.5 15.4 15.7  Hematocrit 37.5 - 51.0 % 54.0(H) 48.0 48.7  Platelets 150 - 450 x10E3/uL 238 228 215   Lab Results  Component Value Date   MCV 87 06/18/2021   MCV 90.6 04/05/2021   MCV 89 08/27/2019   Lab Results  Component Value Date   TSH 0.552 06/18/2021   Lab Results  Component Value Date   HGBA1C 5.9 (H) 11/11/2016     BNP No results found for: BNP  ProBNP No results found for: PROBNP   Lipid Panel     Component Value Date/Time   CHOL 335 (H) 06/18/2021 1128   TRIG 76 06/18/2021 1128   HDL 54 06/18/2021 1128   CHOLHDL 6.2 (H) 06/18/2021 1128   CHOLHDL 3.3 02/28/2017 0848   VLDL 15 02/28/2017 0848   LDLCALC 270 (H) 06/18/2021 1128   LABVLDL 11 06/18/2021 1128     RADIOLOGY: ECHOCARDIOGRAM  COMPLETE  Result Date: 07/07/2021    ECHOCARDIOGRAM REPORT   Patient Name:   KEWAN MCNEASE Date of Exam: 07/07/2021 Medical Rec #:  [id number]       Height:       74.0 in Accession #:    [id number]      Weight:       205.8 lb Date of Birth:  1976/04/10        BSA:          2.200 m Patient Age:    70 years        BP:           118/82 mmHg Patient Gender: M               HR:           118 bpm. Exam Location:  [facility] Procedure: 2D Echo, Cardiac Doppler and Color Doppler Indications:    I48.19 Atrial fibrillation  History:        Patient has no prior history of Echocardiogram examinations.                 Arrythmias:Palpitation; Risk Factors:Hypertension, Dyslipidemia,  Former Smoker, Family History of Coronary Artery Disease and                 Obesity.  Sonographer:    Basilia Jumbo BS, RDCS Referring Phys: [place]  1. Left ventricular ejection fraction, by estimation, is 50 to 55%. The left ventricle has low normal function. The left ventricle has no regional wall motion abnormalities. Left ventricular diastolic parameters are indeterminate.  2. Right ventricular systolic function is normal. The right ventricular size is normal.  3. Left atrial size was mildly dilated.  4. The mitral valve is normal in structure. Mild mitral valve regurgitation. No evidence of mitral stenosis.  5. The aortic valve is normal in structure. Aortic valve regurgitation is mild. No aortic stenosis is present.  6. The inferior vena cava is normal in size with greater than 50% respiratory variability, suggesting right atrial pressure of 3 mmHg. FINDINGS  Left Ventricle: Left ventricular ejection fraction, by estimation, is 50 to 55%. The left ventricle has low normal function. The left ventricle has no regional wall motion abnormalities. The left ventricular internal cavity size was normal in size. There is no left ventricular hypertrophy. Left ventricular diastolic parameters are indeterminate.  Right Ventricle: The right ventricular size is normal. No increase in right ventricular wall thickness. Right ventricular systolic function is normal. Left Atrium: Left atrial size was mildly dilated. Right Atrium: Right atrial size was normal in size. Pericardium: There is no evidence of pericardial effusion. Mitral Valve: The mitral valve is normal in structure. Mild mitral valve regurgitation. No evidence of mitral valve stenosis. Tricuspid Valve: The tricuspid valve is normal in structure. Tricuspid valve regurgitation is mild . No evidence of tricuspid stenosis. Aortic Valve: The aortic valve is normal in structure. Aortic valve regurgitation is mild. No aortic stenosis is present. Pulmonic Valve: The pulmonic valve was normal in structure. Pulmonic valve regurgitation is not visualized. No evidence of pulmonic stenosis. Aorta: The aortic root is normal in size and structure. Venous: The inferior vena cava is normal in size with greater than 50% respiratory variability, suggesting right atrial pressure of 3 mmHg. IAS/Shunts: No atrial level shunt detected by color flow Doppler.  LEFT VENTRICLE PLAX 2D LVIDd:         5.10 cm LVIDs:         3.70 cm LV PW:         0.90 cm LV IVS:        0.90 cm LVOT diam:     2.20 cm LV SV:         58 LV SV Index:   26 LVOT Area:     3.80 cm  RIGHT VENTRICLE RV Basal diam:  3.00 cm RV S prime:     12.30 cm/s TAPSE (M-mode): 1.5 cm LEFT ATRIUM             Index       RIGHT ATRIUM           Index LA diam:        4.40 cm 2.00 cm/m  RA Pressure: 3.00 mmHg LA Vol (A2C):   84.2 ml 38.27 ml/m RA Area:     15.90 cm LA Vol (A4C):   59.9 ml 27.23 ml/m RA Volume:   40.00 ml  18.18 ml/m LA Biplane Vol: 73.6 ml 33.45 ml/m  AORTIC VALVE LVOT Vmax:   90.50 cm/s LVOT Vmean:  60.267 cm/s LVOT VTI:    0.152 m  AORTA Ao  Root diam: 3.30 cm Ao Asc diam:  3.30 cm TRICUSPID VALVE Estimated RAP:  3.00 mmHg  SHUNTS Systemic VTI:  0.15 m Systemic Diam: 2.20 cm Candee Furbish MD Electronically signed by  Candee Furbish MD Signature Date/Time: 07/07/2021/11:15:10 AM    Final      Additional studies/ records that were reviewed today include:    CT Cardiac Scoring: 05/24/2021 FINDINGS: Non-cardiac: See separate report from [facility].   Ascending aorta: Normal diameter 3.2 cm   Pericardium: Normal   Coronary arteries: Age advance 3 vessel coronary calcification most dense in the proximal LAD   IMPRESSION: Coronary calcium score of 269. This was 41 th percentile for age and sex matched control.   ECHO: 07/07/2021 IMPRESSIONS   1. Left ventricular ejection fraction, by estimation, is 50 to 55%. The  left ventricle has low normal function. The left ventricle has no regional  wall motion abnormalities. Left ventricular diastolic parameters are  indeterminate.   2. Right ventricular systolic function is normal. The right ventricular  size is normal.   3. Left atrial size was mildly dilated.   4. The mitral valve is normal in structure. Mild mitral valve  regurgitation. No evidence of mitral stenosis.   5. The aortic valve is normal in structure. Aortic valve regurgitation is  mild. No aortic stenosis is present.   6. The inferior vena cava is normal in size with greater than 50%  respiratory variability, suggesting right atrial pressure of 3 mmHg.   ASSESSMENT:    1. Persistent atrial fibrillation ([place])   2. Familial hyperlipidemia   3. Xanthelasma of eyelid, bilateral   4. Arcus senilis of both corneas   5. Snoring     PLAN:  Mr. Clevon Khader is a 45 year old gentleman who has a previous history of hypertension, obesity, obstructive sleep apnea, tobacco use, and most likely has heterozygous familial hyperlipidemia.  Since 2018 he had major lifestyle changes and has lost in excess of 50 pounds, quit smoking, and essentially stopped all his medications.  Remotely, he had been on statin therapy and in May 2018 and LDL cholesterol was normal at 60.  He has not been on any  lipid-lowering therapy in several years.  Despite his major lifestyle adjustments, he continues to have significant total cholesterol and LDL cholesterol elevation consistent with heterozygous familial hyperlipidemia.  There is a strong family history for premature CAD in his father died suddenly with a heart attack at age 62.  When I  saw him in January 2021 spent considerable time with him discussing the importance of initiation of therapy.  On his cardiac CT he already had evidence for multivessel coronary calcification.  At that time, I discussed the importance of treatment and with his high likelihood for familial hyperlipidemia would benefit most likely from aggressive statin therapy with PCSK9 inhibition.  In addition, at the time he was in atrial fibrillation and ultimately he refused initiation of anticoagulation despite awareness of potential stroke risk.  When seen in September 2020 he had noticed increasing heart palpitations and began to notice exertional dyspnea particularly with increased heart rate with rare chest pain sensation.  During that evaluation his blood pressure was well controlled but he continued to be in longstanding persistent atrial fibrillation not on anticoagulation therapy.  He was advised to initiate anticoagulation with Eliquis but he never pursued this.  Apparently he only took metoprolol for short duration and stopped taking this because he did not feel it was significantly helping.  I  suspect that his dose may have been too low.  I have recommended a rechallenge of metoprolol succinate with 50 mg daily and if after 1 week if his heart rate is greater than 70 I have recommended he increase this to 75 mg daily as long as his blood pressure is stable and he is not having any spells of lightheadedness.  I again discussed rescheduling his coronary CTA procedure.  His heart rate will need to be slowed for the procedure with additional beta-blocker the day of the procedure.  I have  asked that he contact us prior to his CT to report his heart rate readings to see if additional medication will be required at that time.  Presently, he is still not interested in initiating lipid-lowering therapy despite his most recent laboratory showing total cholesterol at 335 and LDL at 270.  He is still trying to obtain more data regarding anticoagulation.  In addition I previously had discussed potentials evaluation for sleep apnea given his atrial fibrillation I will see him in 2 to 3 months for reevaluation.   Medication Adjustments/Labs and Tests Ordered: Current medicines are reviewed at length with the patient today.  Concerns regarding medicines are outlined above.  Medication changes, Labs and Tests ordered today are listed in the Patient Instructions below. Patient Instructions  Medication Instructions:  RESUME metoprolol succinate (Toprol XL) 50 mg daily --after 1 week if HR > 70, increase to 75 mg daily --after 1-2 weeks taking 75 mg, if HR remains >70, increase to 100 mg daily  **PLEASE CALL 1-2 weeks prior to CT to report HR readings, as you may need additional medication for the scan**  *If you need a refill on your cardiac medications before your next appointment, please call your pharmacy*  Testing/Procedures: Reschedule CCTA in 4-6 weeks  Follow-Up: At [facility], you and your health needs are our priority.  As part of our continuing mission to provide you with exceptional heart care, we have created designated Provider Care Teams.  These Care Teams include your primary Cardiologist (physician) and Advanced Practice Providers (APPs -  Physician Assistants and Nurse Practitioners) who all work together to provide you with the care you need, when you need it.  We recommend signing up for the patient portal called "MyChart".  Sign up information is provided on this After Visit Summary.  MyChart is used to connect with patients for Virtual Visits (Telemedicine).  Patients  are able to view lab/test results, encounter notes, upcoming appointments, etc.  Non-urgent messages can be sent to your provider as well.   To learn more about what you can do with MyChart, go to [email or web address].    Your next appointment:   2-3 months  with Dr. Claiborne Billings (after CCTA completed)     Signed, Shelva Majestic, MD  08/03/2021 9:37 PM    [facility] [address] [place], [place], [place]  [address] Phone: [phone]

## 2021-07-27 NOTE — Patient Instructions (Signed)
Medication Instructions:  RESUME metoprolol succinate (Toprol XL) 50 mg daily --after 1 week if HR > 70, increase to 75 mg daily --after 1-2 weeks taking 75 mg, if HR remains >70, increase to 100 mg daily  **PLEASE CALL 1-2 weeks prior to CT to report HR readings, as you may need additional medication for the scan**  *If you need a refill on your cardiac medications before your next appointment, please call your pharmacy*  Testing/Procedures: Reschedule CCTA in 4-6 weeks  Follow-Up: At [facility], you and your health needs are our priority.  As part of our continuing mission to provide you with exceptional heart care, we have created designated Provider Care Teams.  These Care Teams include your primary Cardiologist (physician) and Advanced Practice Providers (APPs -  Physician Assistants and Nurse Practitioners) who all work together to provide you with the care you need, when you need it.  We recommend signing up for the patient portal called "MyChart".  Sign up information is provided on this After Visit Summary.  MyChart is used to connect with patients for Virtual Visits (Telemedicine).  Patients are able to view lab/test results, encounter notes, upcoming appointments, etc.  Non-urgent messages can be sent to your provider as well.   To learn more about what you can do with MyChart, go to [email or web address].    Your next appointment:   2-3 months  with Dr. Claiborne Billings (after CCTA completed)

## 2021-08-03 ENCOUNTER — Encounter: Payer: Self-pay | Admitting: Cardiovascular Disease

## 2021-09-29 ENCOUNTER — Telehealth: Payer: Self-pay

## 2021-09-29 NOTE — Telephone Encounter (Signed)
Letter has been sent to patient instructing them to call us if they are still interested in completing their sleep study. If we have not received a response from the patient within 30 days of this notice, the order will be cancelled and they will need to discuss the need for a sleep study at their next office visit.  ° °

## 2021-11-06 ENCOUNTER — Encounter (HOSPITAL_COMMUNITY): Payer: Self-pay

## 2022-03-29 IMAGING — CR DG HAND COMPLETE 3+V*L*
3 series · 3 of 3 positions shown · non-contrast
Comparison: None.

CLINICAL DATA: Middle finger swelling and redness.

EXAM:
LEFT HAND - COMPLETE 3+ VIEW

[hand pa]
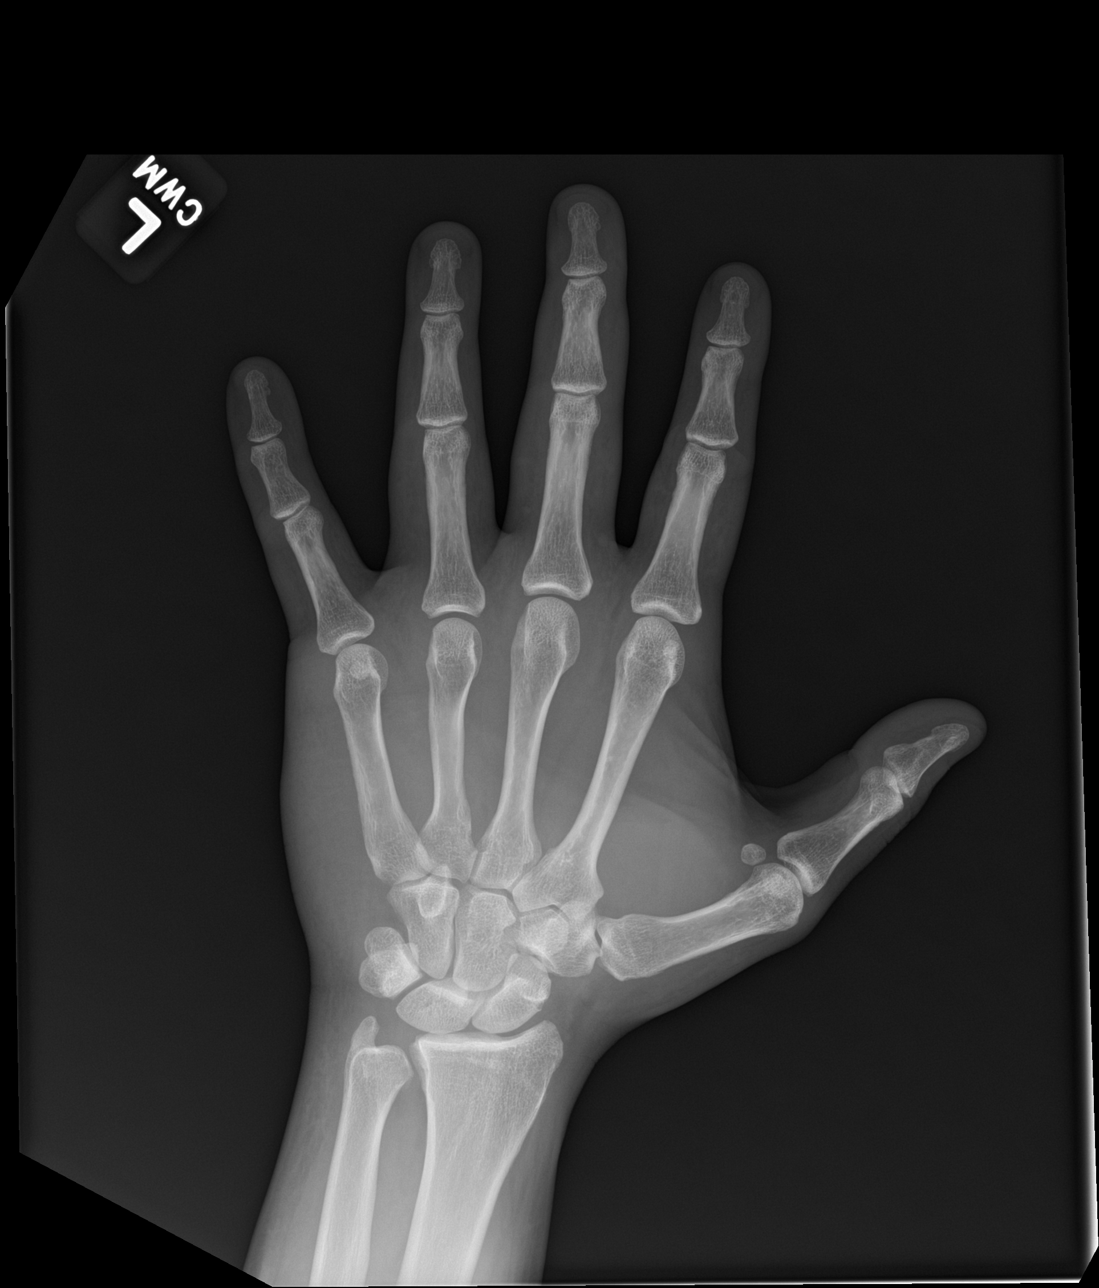

[hand obl]
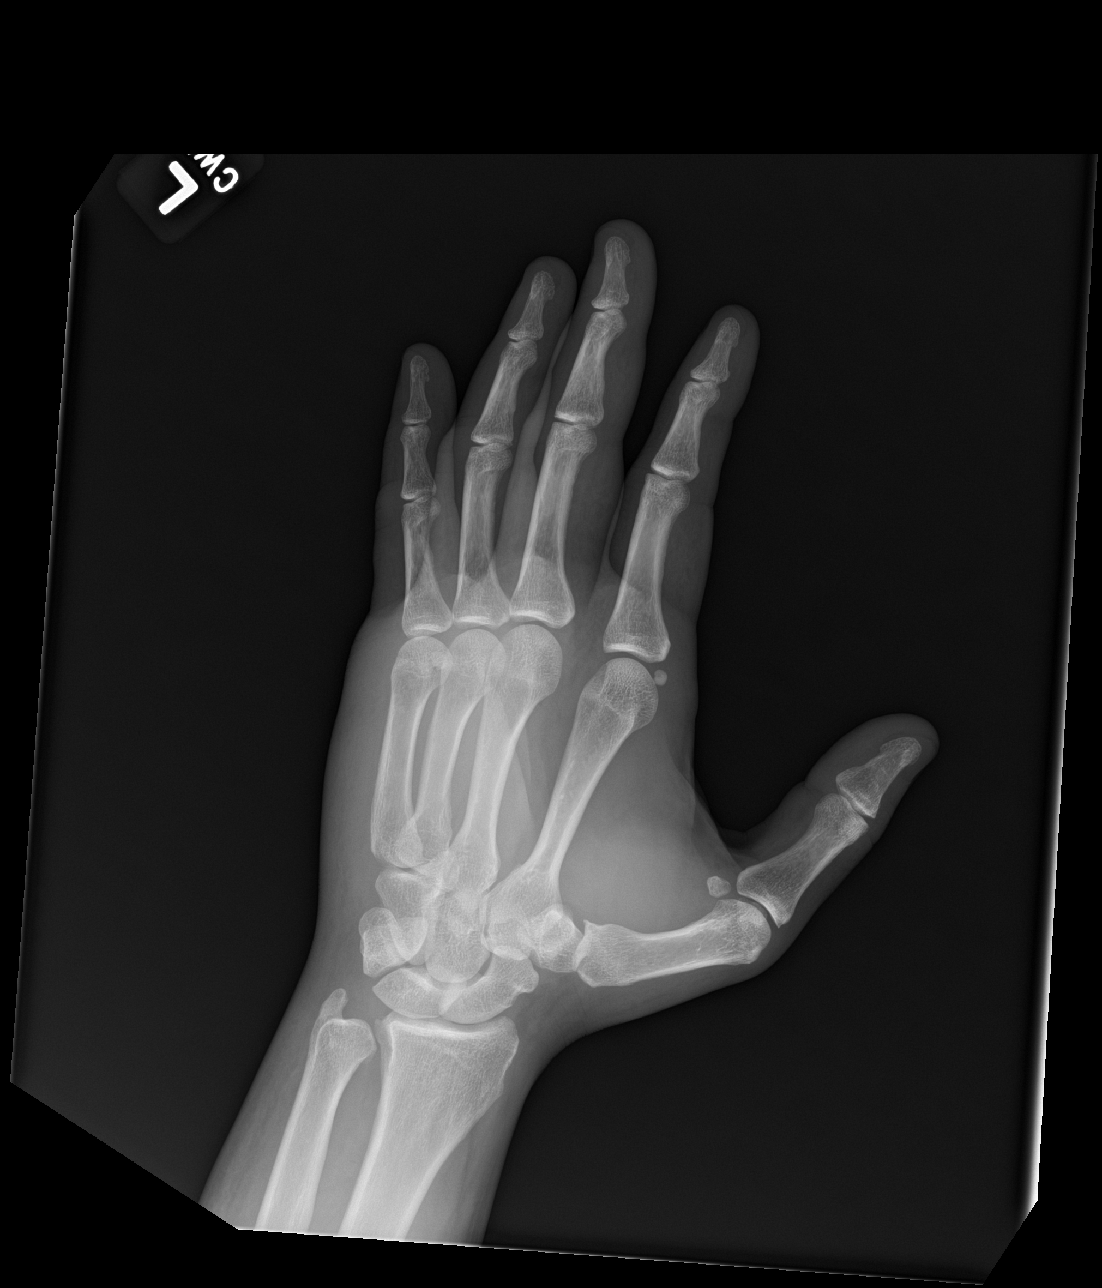

[hand lat]
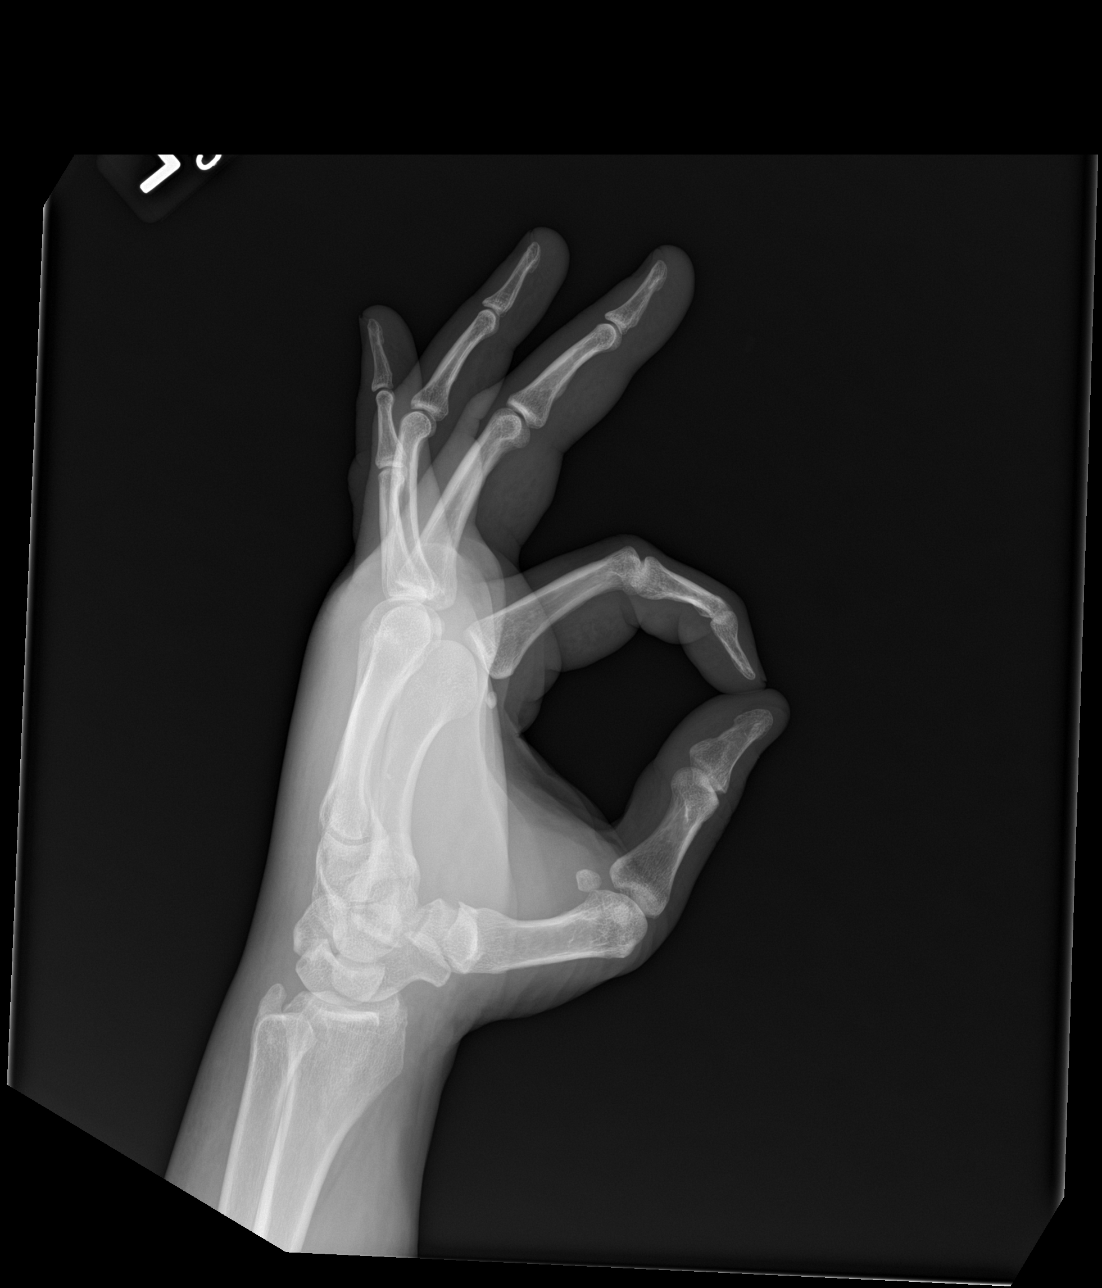

[3 of 3 positions shown; findings below may reference images not displayed]

FINDINGS: Negative for fracture. No bone lesion or arthropathy. Soft tissue
swelling of the middle finger.
IMPRESSION: No bony abnormality.

## 2022-07-06 ENCOUNTER — Telehealth: Payer: Self-pay

## 2022-07-06 NOTE — Telephone Encounter (Signed)
   Pre-operative Risk Assessment    Patient Name: Anthony Garcia  DOB: [birth year] MRN: [id number]      Request for Surgical Clearance    Procedure:   Left Knee Scope Loose body removal  Date of Surgery:  Clearance TBD                                 Surgeon:  Edmonia Lynch, MD Surgeon's Group or Practice Name:  [facility] Phone number:  [phone] Fax number:  [phone]   Type of Clearance Requested:   - Medical    Type of Anesthesia:   Choice   Additional requests/questions:  Please advise surgeon/provider what medications should be held.  Signed, Elsie Lincoln Glendell Fouse   07/06/2022, 9:58 AM

## 2022-07-08 NOTE — Telephone Encounter (Signed)
Pt has been scheduled to see Coletta Memos, NP, 07/13/22 8:50, clearance will be addressed at that time.  Will route back to the requesting surgeon's office to make them aware.

## 2022-07-08 NOTE — Telephone Encounter (Signed)
   Name: Anthony Garcia  DOB: 1976-10-12  MRN: [id number]  Primary Cardiologist: Shelva Majestic, MD  Chart reviewed as part of pre-operative protocol coverage. Because of Nivaan Pastrana's past medical history and time since last visit, he will require a follow-up in-office visit in order to better assess preoperative cardiovascular risk.  Pre-op covering staff: - Please schedule appointment and call patient to inform them. If patient already had an upcoming appointment within acceptable timeframe, please add "pre-op clearance" to the appointment notes so provider is aware. - Please contact requesting surgeon's office via preferred method (i.e, phone, fax) to inform them of need for appointment prior to surgery.  This message will also be routed to pharmacy pool  for input on holding xarelto as requested below so that this information is available to the clearing provider at time of patient's appointment.   Elgie Collard, PA-C  07/08/2022, 3:49 PM

## 2022-07-11 NOTE — Progress Notes (Unsigned)
Cardiology Clinic Note   Patient Name: Anthony Garcia Date of Encounter: 07/13/2022  Primary Care Provider:  Pcp, No Primary Cardiologist:  Shelva Majestic, MD  Patient Profile    Anthony Garcia 46 year old male presents the clinic today for follow-up evaluation of his HTN, HLD, palpitations, and preoperative cardiac evaluation.  Past Medical History    Past Medical History:  Diagnosis Date   Hyperlipidemia    Hypertension    Past Surgical History:  Procedure Laterality Date   ANTERIOR CRUCIATE LIGAMENT REPAIR Left    x3    KNEE ARTHROSCOPY Left     Allergies  No Known Allergies  History of Present Illness    Anthony Garcia has a PMH of persistent atrial fibrillation, HLD, HTN, palpitations, difficulty sleeping, obesity, tobacco abuse and anxiety.  He was seen by Dr. Claiborne Billings 9/22.  He had not pursued any of Dr. Evette Georges previous recommendations.  He reported concern about increased heart rate palpitations.  With the episodes of increased heart rate he noted shortness of breath and reported rare chest type sensations.  He became concerned and presented for follow-up.  He reported that he had not been taking any of his medications and has never started anticoagulation for his atrial fibrillation.  His EKG showed persistent atrial fibrillation.  Risks for CVA were again discussed.  Twice daily apixaban was recommended.  Echocardiogram was also recommended.  After reviewing the agreed to undergo coronary CTA.  For his increased palpitations metoprolol succinate was prescribed.  His subsequent echocardiogram 9/22 showed an EF of 50-55% with no regional wall motion abnormalities and mild left atrial dilation with mild mitral regurgitation.  He followed up 07/27/2021.  He had not started anticoagulation.  He had started  metoprolol but did not feel it was helping.  He stopped taking the medication.  When he presented for coronary CTA his heart rate was too fast and he was not able to  have the procedure done.  He continued to indicate that he did not want to take medication.  He reported that his heart rate had not been as fast recently.  His EKG showed atrial fibrillation with a rate of 94 and nonspecific T wave changes.  He presents to the clinic today for follow-up evaluation and preoperative cardiac evaluation.  He states he feels fairly well.  He is limited in his physical activity due to his left knee pain.  He reports that he previously took statin therapy, and noted joint aches as well as brain fog.  His symptoms improved with weight loss and being off the medication.  We reviewed his CVA risk.  He expressed understanding.  He requested repeat coronary calcium scoring and referral to EP.  He wishes to defer medication at this time and continue to work on diet and exercise.  He does not take anticoagulation or beta blocking agents.  We will plan follow-up in 12 months.  Today he denies chest pain, shortness of breath, lower extremity edema, fatigue, palpitations, melena, hematuria, hemoptysis, diaphoresis, weakness, presyncope, syncope, orthopnea, and PND.     Home Medications    Prior to Admission medications   Medication Sig Start Date End Date Taking? Authorizing Provider  metoprolol succinate (TOPROL-XL) 50 MG 24 hr tablet Take 1 tablet (50 mg total) by mouth daily. Take with or immediately following a meal. 06/18/21 09/16/21  Troy Sine, MD  metoprolol tartrate (LOPRESSOR) 50 MG tablet Take 2 hours prior to procedure 06/24/21   Troy Sine, MD  rivaroxaban (XARELTO) 20 MG TABS tablet Take 1 tablet (20 mg total) by mouth daily with supper. 06/18/21   Troy Sine, MD  VITAMIN D PO Take by mouth.    [provider]    Family History    Family History  Problem Relation Age of Onset   Anemia Mother    Heart attack Father    Hyperlipidemia Father    Hypertension Father    Hyperlipidemia Brother    Hypertension Brother    Atrial fibrillation  Brother    Healthy Son    Healthy Son    Healthy Son    Allergic rhinitis Neg Hx    Angioedema Neg Hx    Asthma Neg Hx    Eczema Neg Hx    Immunodeficiency Neg Hx    Urticaria Neg Hx    He indicated that his mother is alive. He indicated that his father is deceased. He indicated that his brother is alive. He indicated that his maternal grandmother is deceased. He indicated that his maternal grandfather is deceased. He indicated that his paternal grandmother is deceased. He indicated that his paternal grandfather is deceased. He indicated that all of his three sons are alive. He indicated that the status of his neg hx is unknown.  Social History    Social History   Socioeconomic History   Marital status: Single    Spouse name: [name]    Number of children: 3   Years of education: Not on file   Highest education level: 12th grade  Occupational History   Occupation: [employer]  Tobacco Use   Smoking status: Former    Packs/day: 0.50    Years: 15.00    Total pack years: 7.50    Types: Cigarettes   Smokeless tobacco: Never  Substance and Sexual Activity   Alcohol use: No   Drug use: Yes    Types: Marijuana   Sexual activity: Yes  Other Topics Concern   Not on file  Social History Narrative   Right handed    Caffeine 1 - 2 cups daily     Lives at home with wife/children    Social Determinants of Health   Financial Resource Strain: Not on file  Food Insecurity: Not on file  Transportation Needs: Not on file  Physical Activity: Not on file  Stress: Not on file  Social Connections: Not on file  Intimate Partner Violence: Not on file     Review of Systems    General:  No chills, fever, night sweats or weight changes.  Cardiovascular:  No chest pain, dyspnea on exertion, edema, orthopnea, palpitations, paroxysmal nocturnal dyspnea. Dermatological: No rash, lesions/masses Respiratory: No cough, dyspnea Urologic: No hematuria, dysuria Abdominal:   No nausea,  vomiting, diarrhea, bright red blood per rectum, melena, or hematemesis Neurologic:  No visual changes, wkns, changes in mental status. All other systems reviewed and are otherwise negative except as noted above.  Physical Exam    VS:  BP 128/86   Pulse 97   Ht '6\' 2"'$  (1.88 m)   Wt 204 lb 6.4 oz (92.7 kg)   SpO2 99%   BMI 26.24 kg/m  , BMI Body mass index is 26.24 kg/m. GEN: Well nourished, well developed, in no acute distress. HEENT: normal. Neck: Supple, no JVD, carotid bruits, or masses. Cardiac: Irregularly irregular, no murmurs, rubs, or gallops. No clubbing, cyanosis, edema.  Radials/DP/PT 2+ and equal bilaterally.  Respiratory:  Respirations regular and unlabored, clear to auscultation bilaterally.  GI: Soft, nontender, nondistended, BS + x 4. MS: no deformity or atrophy. Skin: warm and dry, no rash. Neuro:  Strength and sensation are intact. Psych: Normal affect.  Accessory Clinical Findings    Recent Labs: No results found for requested labs within last 365 days.   Recent Lipid Panel    Component Value Date/Time   CHOL 335 (H) 06/18/2021 1128   TRIG 76 06/18/2021 1128   HDL 54 06/18/2021 1128   CHOLHDL 6.2 (H) 06/18/2021 1128   CHOLHDL 3.3 02/28/2017 0848   VLDL 15 02/28/2017 0848   LDLCALC 270 (H) 06/18/2021 1128         ECG personally reviewed by me today-atrial fibrillation 97 bpm- No acute changes  CT Cardiac Scoring: 05/24/2021 FINDINGS: Non-cardiac: See separate report from [facility].   Ascending aorta: Normal diameter 3.2 cm   Pericardium: Normal   Coronary arteries: Age advance 3 vessel coronary calcification most dense in the proximal LAD   IMPRESSION: Coronary calcium score of 269. This was 55 th percentile for age and sex matched control.   ECHO: 07/07/2021 IMPRESSIONS   1. Left ventricular ejection fraction, by estimation, is 50 to 55%. The  left ventricle has low normal function. The left ventricle has no regional  wall  motion abnormalities. Left ventricular diastolic parameters are  indeterminate.   2. Right ventricular systolic function is normal. The right ventricular  size is normal.   3. Left atrial size was mildly dilated.   4. The mitral valve is normal in structure. Mild mitral valve  regurgitation. No evidence of mitral stenosis.   5. The aortic valve is normal in structure. Aortic valve regurgitation is  mild. No aortic stenosis is present.   6. The inferior vena cava is normal in size with greater than 50%  respiratory variability, suggesting right atrial pressure of 3 mmHg.   Assessment & Plan   1.  Atrial fibrillation-EKG today shows atrial fibrillation 97 bpm.  Previously recommended anticoagulation to lower her CVA risk.  He wished to defer anticoagulation initiation.  He was prescribed metoprolol but stopped taking medication because he did not feel it was working.  Reviewed CVA risk. Avoid triggers caffeine, chocolate, EtOH, dehydration etc. Referred to EP  Familial hyperlipidemia-LDL 270 on 06/18/21.  Underwent CT cardiac scoring 8/22.  He was placed in the 69 percentile for age and sex matched control and his coronary calcium score was 269.  He made major lifestyle changes, lost 50 pounds, quit smoking, and stopped all of his medications.  5/18 his LDL cholesterol was 60 and statin therapy was discontinued.  Dr. Claiborne Billings recommended he continue statin therapy as well as PCSK9 inhibitor.    Coronary CTA was recommended and initially he was not able to perform testing due to elevated heart rate.  He would like to repeat cardiac calcium scoring and continue to work on diet and exercise.  Previously statin intolerant.  Took rosuvastatin for 10 years and transitioned to atorvastatin and was still intolerant.  Reviewed PCS K9 inhibitors. Repeat cardiac calcium scoring Heart and low-sodium high-fiber diet Increase physical activity as tolerated  Preoperative cardiac evaluation-left knee arthroscopy for  loose body removal, [facility]  Primary Cardiologist: Shelva Majestic, MD  Chart reviewed as part of pre-operative protocol coverage. Given past medical history and time since last visit, based on ACC/AHA guidelines, Cabe Doughtie would be at acceptable risk for the planned procedure without further cardiovascular testing.   Patient was advised that if he develops new  symptoms prior to surgery to contact our office to arrange a follow-up appointment.  He verbalized understanding.   His RCRI is a class II risk, 0.9% risk of major cardiac event.  He is able to complete greater than 4 METS of physical activity.   Disposition: Follow-up with Dr. Claiborne Billings in 1 yr.     Jossie Ng. Kreston Ahrendt NP-C     07/13/2022, 9:23 AM [address] Office [phone] Fax [phone]  Notice: This dictation was prepared with Dragon dictation along with smaller phrase technology. Any transcriptional errors that result from this process are unintentional and may not be corrected upon review.  I spent 15 minutes examining this patient, reviewing medications, and using patient centered shared decision making involving her cardiac care.  Prior to her visit I spent greater than 20 minutes reviewing her past medical history,  medications, and prior cardiac tests.

## 2022-07-13 ENCOUNTER — Ambulatory Visit: Payer: No Typology Code available for payment source | Attending: General Practice | Admitting: General Practice

## 2022-07-13 ENCOUNTER — Encounter: Payer: Self-pay | Admitting: General Practice

## 2022-07-13 VITALS — BP 128/86 | HR 97 | Ht 74.0 in | Wt 204.4 lb

## 2022-07-13 DIAGNOSIS — I4819 Other persistent atrial fibrillation: Secondary | ICD-10-CM | POA: Diagnosis not present

## 2022-07-13 DIAGNOSIS — E7849 Other hyperlipidemia: Secondary | ICD-10-CM | POA: Diagnosis not present

## 2022-07-13 DIAGNOSIS — Z01810 Encounter for preprocedural cardiovascular examination: Secondary | ICD-10-CM | POA: Diagnosis not present

## 2022-07-13 DIAGNOSIS — I2510 Atherosclerotic heart disease of native coronary artery without angina pectoris: Secondary | ICD-10-CM | POA: Diagnosis not present

## 2022-07-13 NOTE — Telephone Encounter (Signed)
Patient is not on anticoagulation, therefore no hold is needed.

## 2022-07-13 NOTE — Patient Instructions (Signed)
Medication Instructions:  NO MEDS *If you need a refill on your cardiac medications before your next appointment, please call your pharmacy*  Lab Work: NONE  Testing/Procedures: CALCIUM SCORE $  REFER TO EP  Other Instructions OK FOR YOUR SURGERY  PLEASE READ AND FOLLOW ATTACHED FIBER DIET  INCREASE PHYSICAL ACTIVITY AS TOLERATED  Follow-Up: At [facility], you and your health needs are our priority.  As part of our continuing mission to provide you with exceptional heart care, we have created designated Provider Care Teams.  These Care Teams include your primary Cardiologist (physician) and Advanced Practice Providers (APPs -  Physician Assistants and Nurse Practitioners) who all work together to provide you with the care you need, when you need it.  Your next appointment:   12 month(s)  The format for your next appointment:   In Person  Provider:   Shelva Majestic, MD     Important Information About Sugar       High-Fiber Eating Plan Fiber, also called dietary fiber, is a type of carbohydrate. It is found foods such as fruits, vegetables, whole grains, and beans. A high-fiber diet can have many health benefits. Your health care provider may recommend a high-fiber diet to help: Prevent constipation. Fiber can make your bowel movements more regular. Lower your cholesterol. Relieve the following conditions: Inflammation of veins in the anus (hemorrhoids). Inflammation of specific areas of the digestive tract (uncomplicated diverticulosis). A problem of the large intestine, also called the colon, that sometimes causes pain and diarrhea (irritable bowel syndrome, or IBS). Prevent overeating as part of a weight-loss plan. Prevent heart disease, type 2 diabetes, and certain cancers. What are tips for following this plan? Reading food labels  Check the nutrition facts label on food products for the amount of dietary fiber. Choose foods that have 5 grams of fiber or  more per serving. The goals for recommended daily fiber intake include: Men (age 42 or younger): 34-38 g. Men (over age 77): 28-34 g. Women (age 51 or younger): 25-28 g. Women (over age 53): 22-25 g. Your daily fiber goal is _____________ g. Shopping Choose whole fruits and vegetables instead of processed forms, such as apple juice or applesauce. Choose a wide variety of high-fiber foods such as avocados, lentils, oats, and kidney beans. Read the nutrition facts label of the foods you choose. Be aware of foods with added fiber. These foods often have high sugar and sodium amounts per serving. Cooking Use whole-grain flour for baking and cooking. Cook with brown rice instead of white rice. Meal planning Start the day with a breakfast that is high in fiber, such as a cereal that contains 5 g of fiber or more per serving. Eat breads and cereals that are made with whole-grain flour instead of refined flour or white flour. Eat brown rice, bulgur wheat, or millet instead of white rice. Use beans in place of meat in soups, salads, and pasta dishes. Be sure that half of the grains you eat each day are whole grains. General information You can get the recommended daily intake of dietary fiber by: Eating a variety of fruits, vegetables, grains, nuts, and beans. Taking a fiber supplement if you are not able to take in enough fiber in your diet. It is better to get fiber through food than from a supplement. Gradually increase how much fiber you consume. If you increase your intake of dietary fiber too quickly, you may have bloating, cramping, or gas. Drink plenty of water to  help you digest fiber. Choose high-fiber snacks, such as berries, raw vegetables, nuts, and popcorn. What foods should I eat? Fruits Berries. Pears. Apples. Oranges. Avocado. Prunes and raisins. Dried figs. Vegetables Sweet potatoes. Spinach. Kale. Artichokes. Cabbage. Broccoli. Cauliflower. Green peas. Carrots.  Squash. Grains Whole-grain breads. Multigrain cereal. Oats and oatmeal. Brown rice. Barley. Bulgur wheat. [place]. Quinoa. Bran muffins. Popcorn. Rye wafer crackers. Meats and other proteins Navy beans, kidney beans, and pinto beans. Soybeans. Split peas. Lentils. Nuts and seeds. Dairy Fiber-fortified yogurt. Beverages Fiber-fortified soy milk. Fiber-fortified orange juice. Other foods Fiber bars. The items listed above may not be a complete list of recommended foods and beverages. Contact a dietitian for more information. What foods should I avoid? Fruits Fruit juice. Cooked, strained fruit. Vegetables Fried potatoes. Canned vegetables. Well-cooked vegetables. Grains White bread. Pasta made with refined flour. White rice. Meats and other proteins Fatty cuts of meat. Fried chicken or fried fish. Dairy Milk. Yogurt. Cream cheese. Sour cream. Fats and oils Butters. Beverages Soft drinks. Other foods Cakes and pastries. The items listed above may not be a complete list of foods and beverages to avoid. Talk with your dietitian about what choices are best for you. Summary Fiber is a type of carbohydrate. It is found in foods such as fruits, vegetables, whole grains, and beans. A high-fiber diet has many benefits. It can help to prevent constipation, lower blood cholesterol, aid weight loss, and reduce your risk of heart disease, diabetes, and certain cancers. Increase your intake of fiber gradually. Increasing fiber too quickly may cause cramping, bloating, and gas. Drink plenty of water while you increase the amount of fiber you consume. The best sources of fiber include whole fruits and vegetables, whole grains, nuts, seeds, and beans. This information is not intended to replace advice given to you by your health care provider. Make sure you discuss any questions you have with your health care provider. Document Revised: 02/07/2020 Document Reviewed: 02/07/2020 Elsevier Patient  Education  [place].

## 2022-07-17 NOTE — H&P (Incomplete)
PREOPERATIVE H&P  Chief Complaint: LOOSE BODY LEFT KNEE  HPI: Anthony Garcia is a 46 y.o. male who presents with a diagnosis of LOOSE BODY LEFT KNEE. Symptoms are rated as moderate to severe, and have been worsening.  This is significantly impairing activities of daily living.  He has elected for surgical management.   Past Medical History:  Diagnosis Date   Hyperlipidemia    Hypertension    Past Surgical History:  Procedure Laterality Date   ANTERIOR CRUCIATE LIGAMENT REPAIR Left    x3    KNEE ARTHROSCOPY Left    Social History   Socioeconomic History   Marital status: Single    Spouse name: [name]    Number of children: 3   Years of education: Not on file   Highest education level: 12th grade  Occupational History   Occupation: [employer] [place]  Tobacco Use   Smoking status: Former    Packs/day: 0.50    Years: 15.00    Total pack years: 7.50    Types: Cigarettes   Smokeless tobacco: Never  Substance and Sexual Activity   Alcohol use: No   Drug use: Yes    Types: Marijuana   Sexual activity: Yes  Other Topics Concern   Not on file  Social History Narrative   Right handed    Caffeine 1 - 2 cups daily     Lives at home with wife/children    Social Determinants of Health   Financial Resource Strain: Not on file  Food Insecurity: Not on file  Transportation Needs: Not on file  Physical Activity: Not on file  Stress: Not on file  Social Connections: Not on file   Family History  Problem Relation Age of Onset   Anemia Mother    Heart attack Father    Hyperlipidemia Father    Hypertension Father    Hyperlipidemia Brother    Hypertension Brother    Atrial fibrillation Brother    Healthy Son    Healthy Son    Healthy Son    Allergic rhinitis Neg Hx    Angioedema Neg Hx    Asthma Neg Hx    Eczema Neg Hx    Immunodeficiency Neg Hx    Urticaria Neg Hx    No Known Allergies Prior to Admission medications   Not on File     Positive ROS: All other  systems have been reviewed and were otherwise negative with the exception of those mentioned in the HPI and as above.  Physical Exam: General: Alert, no acute distress Cardiovascular: No pedal edema Respiratory: No cyanosis, no use of accessory musculature GI: No organomegaly, abdomen is soft and non-tender Skin: No lesions in the area of chief complaint Neurologic: Sensation intact distally Psychiatric: Patient is competent for consent with normal mood and affect Lymphatic: No axillary or cervical lymphadenopathy  MUSCULOSKELETAL: TTP medial joint line, can palpate a large loose body medially, ROM 0-120, good strength, minor effusion, + McMurray's test, NVI   Imaging: left knee MRI shows 2 ossified loose bodies along the peripheral margin or the medial femoral condyle   Assessment: LOOSE BODY LEFT KNEE  Plan: Plan for Procedure(s): LEFT ARTHROSCOPY KNEE AND ARTHROTOMY WITH LOOSE BODY REMOVAL  The risks benefits and alternatives were discussed with the patient including but not limited to the risks of nonoperative treatment, versus surgical intervention including infection, bleeding, nerve injury,  blood clots, cardiopulmonary complications, morbidity, mortality, among others, and they were willing to proceed.   Weightbearing: WBAT LLE  Orthopedic devices: none Showering: POD 3 Dressing: reinforce PRN Medicines: ASA, Norco, Celebrex, Zofran  Discharge: home Follow up: 08/13/22 at 11:15am    Britt Bottom, PA-C Office [phone] 07/17/2022 7:58 AM

## 2022-07-28 ENCOUNTER — Encounter (HOSPITAL_BASED_OUTPATIENT_CLINIC_OR_DEPARTMENT_OTHER): Payer: Self-pay | Admitting: Orthopedic Surgery

## 2022-07-28 NOTE — Progress Notes (Addendum)
Addendum:  reviewed chart w/ anesthesia, Dr Jenita Seashore [facility]. Including the fact pt does not take the recommended medication for bp, rate control for afib, blood thinner per pt's cardiologist, Dr Claiborne Billings , and Coletta Memos NP notes in epic and pt told me he did not take the medication.   Dr Glennon Mac sent secure chat in epic to Dr Claiborne Billings informing him the pt probably need a new prescription for rate control medication prior to elective surgery. However, Dr Claiborne Billings was not on line so I called [place] told Dr Claiborne Billings is on vacation this week I requested an APP in pre-op pool to call back today to inform them of this and left message at Dr Debroah Loop office to let him know. Per Dr Glennon Mac pt need to be on rate control medication in order for him to have his elective surgery.   Spoke w/ via phone for pre-op interview--- pt Lab needs dos----   [employer] results------ current ekg in epic/ chart COVID test -----patient states asymptomatic no test needed Arrive at ------- 0530 on 08-03-2022 NPO after MN NO Solid Food.  Clear liquids from MN until--- 0430 Med rec completed Medications to take morning of surgery ----- none Diabetic medication ----- n/a Patient instructed no nail polish to be worn day of surgery Patient instructed to bring photo id and insurance card day of surgery Patient aware to have Driver (ride ) / caregiver  for 24 hours after surgery -- wife , stacey Patient Special Instructions ----- pt stated was given instructions to stop his supplements week before surgery and he had already stopped before today Pre-Op special Istructions ----- pt has office visit cardiac clearance by Coletta Memos NP on 07-13-2022 in epic/ chart Patient verbalized understanding of instructions that were given at this phone interview. Patient denies shortness of breath, chest pain, fever, cough at this phone interview.    Anesthesia Review:  HTN;  Persistent AFib; pt is noncompliance with medication  for bp / afib and does not take blood thinner including no asa. Pt stated no cardiac s&s other than the normal palpitations due to afib, only gets sob when heart rare is rapid.  PCP:  pt state NO pcp Cardiologist : Dr Corky Downs (lov 07-13-2022 epic) EKG : 07-13-2022 Echo : 07-07-2021 Stress test: no Cardiac Cath :  no Sleep Study/ CPAP : NO

## 2022-07-30 NOTE — Telephone Encounter (Signed)
[facility] is calling in regards to the surgery that is scheduled for this Tuesday 10/17. She was trying to reach Dr. Claiborne Billings to figure out what to do since patient refuses the med recommended. Requesting to speak to someone.

## 2022-07-30 NOTE — Telephone Encounter (Signed)
I will forward to pre op provider to see notes from surgeon office

## 2022-08-02 ENCOUNTER — Telehealth: Payer: Self-pay | Admitting: Cardiovascular Disease

## 2022-08-02 NOTE — Telephone Encounter (Signed)
Caller stated she will need to speak with someone regarding the patient's pre-op clearance and the medication he is on.  Caller noted the patient is scheduled for surgery tomorrow (08/03/22).  Caller stated she can be overhead paged for this call back.

## 2022-08-02 NOTE — Telephone Encounter (Signed)
I tried to reach [facility] about this pt. Dawn was not available. I will re-fax notes stating: August 02, 2022 [name], NP    08/02/22 12:35 PM Note    Name: Anthony Garcia  DOB: 07/21/1976  MRN: [id number]    Primary Cardiologist: Shelva Majestic, MD   Chart reviewed as part of pre-operative protocol coverage. Anthony Garcia was last seen on 07/13/2022 by Coletta Memos, NP and was cleared for procedure at that time. The patient is not on anticoagulation at this time.    I will route this recommendation as well as the note from most recent office visit to the requesting party via Epic fax function and remove from pre-op pool. Please call with questions.   [name], NP 08/02/2022, 12:34 PM

## 2022-08-02 NOTE — Telephone Encounter (Signed)
   Name: Anthony Garcia  DOB: Sep 30, 1976  MRN: [id number]   Primary Cardiologist: Shelva Majestic, MD  Chart reviewed as part of pre-operative protocol coverage. Anthony Garcia was last seen on 07/13/2022 by Coletta Memos, NP and was cleared for procedure at that time. The patient is not on anticoagulation at this time.   I will route this recommendation as well as the note from most recent office visit to the requesting party via Epic fax function and remove from pre-op pool. Please call with questions.  [name], NP 08/02/2022, 12:34 PM

## 2022-08-02 NOTE — Progress Notes (Signed)
Called and left message with Dr. Jamison Oka scheduler about follow up with cardiology in reference to medication prior to surgery. Awaiting call back.

## 2022-08-03 ENCOUNTER — Encounter (HOSPITAL_BASED_OUTPATIENT_CLINIC_OR_DEPARTMENT_OTHER)
Admission: RE | Disposition: A | Discharge status: Home / Self Care | Payer: Self-pay | Source: Home / Self Care | Attending: Orthopedic Surgery

## 2022-08-03 ENCOUNTER — Ambulatory Visit (HOSPITAL_BASED_OUTPATIENT_CLINIC_OR_DEPARTMENT_OTHER): Payer: No Typology Code available for payment source | Admitting: Certified Registered Nurse Anesthetist

## 2022-08-03 ENCOUNTER — Encounter (HOSPITAL_BASED_OUTPATIENT_CLINIC_OR_DEPARTMENT_OTHER): Payer: Self-pay | Admitting: Orthopedic Surgery

## 2022-08-03 ENCOUNTER — Other Ambulatory Visit: Payer: Self-pay

## 2022-08-03 ENCOUNTER — Ambulatory Visit (HOSPITAL_BASED_OUTPATIENT_CLINIC_OR_DEPARTMENT_OTHER)
Admission: RE | Admit: 2022-08-03 | Discharge: 2022-08-03 | Disposition: A | Discharge status: Home / Self Care | Payer: No Typology Code available for payment source | Attending: Orthopedic Surgery | Admitting: Orthopedic Surgery

## 2022-08-03 DIAGNOSIS — I4891 Unspecified atrial fibrillation: Secondary | ICD-10-CM | POA: Insufficient documentation

## 2022-08-03 DIAGNOSIS — I10 Essential (primary) hypertension: Secondary | ICD-10-CM

## 2022-08-03 DIAGNOSIS — Z87891 Personal history of nicotine dependence: Secondary | ICD-10-CM | POA: Insufficient documentation

## 2022-08-03 DIAGNOSIS — M2342 Loose body in knee, left knee: Secondary | ICD-10-CM

## 2022-08-03 DIAGNOSIS — Z91148 Patient's other noncompliance with medication regimen for other reason: Secondary | ICD-10-CM | POA: Diagnosis not present

## 2022-08-03 DIAGNOSIS — Z9889 Other specified postprocedural states: Secondary | ICD-10-CM

## 2022-08-03 DIAGNOSIS — Z01818 Encounter for other preprocedural examination: Secondary | ICD-10-CM

## 2022-08-03 HISTORY — DX: Unspecified osteoarthritis, unspecified site: M19.90

## 2022-08-03 HISTORY — DX: Loose body in knee, left knee: M23.42

## 2022-08-03 HISTORY — DX: Patient's noncompliance with other medical treatment and regimen due to unspecified reason: Z91.199

## 2022-08-03 HISTORY — DX: Unspecified hearing loss, left ear: H91.92

## 2022-08-03 HISTORY — DX: Anxiety disorder, unspecified: F41.9

## 2022-08-03 HISTORY — PX: KNEE ARTHROSCOPY: SHX127

## 2022-08-03 SURGERY — ARTHROSCOPY, KNEE
Anesthesia: General | Site: Knee | Laterality: Left

## 2022-08-03 MED ORDER — MIDAZOLAM HCL 5 MG/5ML IJ SOLN
INTRAMUSCULAR | Status: DC | PRN
  Administered 2022-08-03: 2 mg via INTRAVENOUS

## 2022-08-03 MED ORDER — ASPIRIN 325 MG PO TBEC: 325.0000 mg | DELAYED_RELEASE_TABLET | Freq: Every day | ORAL | 0 refills | Status: DC

## 2022-08-03 MED ORDER — FENTANYL CITRATE (PF) 100 MCG/2ML IJ SOLN
INTRAMUSCULAR | Status: AC
  Filled 2022-08-03: qty 2

## 2022-08-03 MED ORDER — SODIUM CHLORIDE 0.9 % IR SOLN
Status: DC | PRN
  Administered 2022-08-03: 5500 mL

## 2022-08-03 MED ORDER — BUPIVACAINE HCL (PF) 0.5 % IJ SOLN
INTRAMUSCULAR | Status: AC
  Filled 2022-08-03: qty 30

## 2022-08-03 MED ORDER — BUPIVACAINE HCL (PF) 0.5 % IJ SOLN
INTRAMUSCULAR | Status: DC | PRN
  Administered 2022-08-03: 10 mL via INTRA_ARTICULAR

## 2022-08-03 MED ORDER — ACETAMINOPHEN 10 MG/ML IV SOLN: 1000.0000 mg | Freq: Once | INTRAVENOUS | Status: DC | PRN

## 2022-08-03 MED ORDER — PROPOFOL 10 MG/ML IV BOLUS
INTRAVENOUS | Status: DC | PRN
  Administered 2022-08-03: 200 mg via INTRAVENOUS

## 2022-08-03 MED ORDER — ACETAMINOPHEN 500 MG PO TABS
1000.0000 mg | ORAL_TABLET | Freq: Once | ORAL | Status: AC
  Administered 2022-08-03: 1000 mg via ORAL

## 2022-08-03 MED ORDER — FENTANYL CITRATE (PF) 100 MCG/2ML IJ SOLN: 25.0000 ug | INTRAMUSCULAR | Status: DC | PRN

## 2022-08-03 MED ORDER — ONDANSETRON 4 MG PO TBDP: 4.0000 mg | ORAL_TABLET | Freq: Two times a day (BID) | ORAL | 0 refills | Status: DC | PRN

## 2022-08-03 MED ORDER — PROPOFOL 500 MG/50ML IV EMUL
INTRAVENOUS | Status: DC | PRN
  Administered 2022-08-03: 25 ug/kg/min via INTRAVENOUS

## 2022-08-03 MED ORDER — LACTATED RINGERS IV SOLN: INTRAVENOUS | Status: DC

## 2022-08-03 MED ORDER — ACETAMINOPHEN 500 MG PO TABS: 1000.0000 mg | ORAL_TABLET | Freq: Four times a day (QID) | ORAL | 0 refills | Status: DC | PRN

## 2022-08-03 MED ORDER — DEXMEDETOMIDINE HCL IN NACL 80 MCG/20ML IV SOLN
INTRAVENOUS | Status: DC | PRN
  Administered 2022-08-03: 8 ug via BUCCAL

## 2022-08-03 MED ORDER — POVIDONE-IODINE 10 % EX SWAB
2.0000 | Freq: Once | CUTANEOUS | Status: AC
  Administered 2022-08-03: 2 via TOPICAL

## 2022-08-03 MED ORDER — DEXMEDETOMIDINE HCL IN NACL 80 MCG/20ML IV SOLN
INTRAVENOUS | Status: AC
  Filled 2022-08-03: qty 20

## 2022-08-03 MED ORDER — MIDAZOLAM HCL 2 MG/2ML IJ SOLN
INTRAMUSCULAR | Status: AC
  Filled 2022-08-03: qty 2

## 2022-08-03 MED ORDER — CELECOXIB 200 MG PO CAPS: 200.0000 mg | ORAL_CAPSULE | Freq: Two times a day (BID) | ORAL | 0 refills | Status: DC | PRN

## 2022-08-03 MED ORDER — ONDANSETRON HCL 4 MG/2ML IJ SOLN
INTRAMUSCULAR | Status: DC | PRN
  Administered 2022-08-03: 4 mg via INTRAVENOUS

## 2022-08-03 MED ORDER — ACETAMINOPHEN 500 MG PO TABS
ORAL_TABLET | ORAL | Status: AC
  Filled 2022-08-03: qty 2

## 2022-08-03 MED ORDER — CEFAZOLIN SODIUM-DEXTROSE 2-4 GM/100ML-% IV SOLN
2.0000 g | INTRAVENOUS | Status: AC
  Administered 2022-08-03: 2 g via INTRAVENOUS

## 2022-08-03 MED ORDER — BUPIVACAINE HCL (PF) 0.25 % IJ SOLN
INTRAMUSCULAR | Status: AC
  Filled 2022-08-03: qty 30

## 2022-08-03 MED ORDER — HYDROCODONE-ACETAMINOPHEN 5-325 MG PO TABS: 1.0000 | ORAL_TABLET | Freq: Four times a day (QID) | ORAL | 0 refills | Status: DC | PRN

## 2022-08-03 MED ORDER — DEXAMETHASONE SODIUM PHOSPHATE 10 MG/ML IJ SOLN: 8.0000 mg | Freq: Once | INTRAMUSCULAR | Status: DC

## 2022-08-03 MED ORDER — FENTANYL CITRATE (PF) 100 MCG/2ML IJ SOLN
INTRAMUSCULAR | Status: DC | PRN
  Administered 2022-08-03: 50 ug via INTRAVENOUS
  Administered 2022-08-03 (×2): 25 ug via INTRAVENOUS
  Administered 2022-08-03: 50 ug via INTRAVENOUS

## 2022-08-03 MED ORDER — DEXAMETHASONE SODIUM PHOSPHATE 10 MG/ML IJ SOLN
INTRAMUSCULAR | Status: DC | PRN
  Administered 2022-08-03: 4 mg via INTRAVENOUS

## 2022-08-03 MED ORDER — CEFAZOLIN SODIUM-DEXTROSE 2-4 GM/100ML-% IV SOLN
INTRAVENOUS | Status: AC
  Filled 2022-08-03: qty 100

## 2022-08-03 MED ORDER — LIDOCAINE HCL (CARDIAC) PF 100 MG/5ML IV SOSY
PREFILLED_SYRINGE | INTRAVENOUS | Status: DC | PRN
  Administered 2022-08-03: 60 mg via INTRAVENOUS

## 2022-08-03 SURGICAL SUPPLY — 43 items
APL PRP STRL LF DISP 70% ISPRP (MISCELLANEOUS) ×1
BLADE EXCALIBUR 4.0X13 (MISCELLANEOUS) IMPLANT
BNDG ELASTIC 6X5.8 VLCR STR LF (GAUZE/BANDAGES/DRESSINGS) ×1 IMPLANT
CHLORAPREP W/TINT 26 (MISCELLANEOUS) ×1 IMPLANT
CUFF TOURN SGL QUICK 34 (TOURNIQUET CUFF) ×1
CUFF TRNQT CYL 34X4.125X (TOURNIQUET CUFF) ×1 IMPLANT
DISSECTOR  3.8MM X 13CM (MISCELLANEOUS) ×1
DISSECTOR 3.8MM X 13CM (MISCELLANEOUS) ×1 IMPLANT
DISSECTOR 4.0MM X 13CM (MISCELLANEOUS) IMPLANT
DRAPE ARTHROSCOPY W/POUCH 90 (DRAPES) ×1 IMPLANT
DRAPE IMP U-DRAPE 54X76 (DRAPES) ×1 IMPLANT
DRAPE U-SHAPE 47X51 STRL (DRAPES) ×1 IMPLANT
DRSG EMULSION OIL 3X3 NADH (GAUZE/BANDAGES/DRESSINGS) ×1 IMPLANT
EXCALIBUR 3.8MM X 13CM (MISCELLANEOUS) IMPLANT
GAUZE SPONGE 4X4 12PLY STRL (GAUZE/BANDAGES/DRESSINGS) ×1 IMPLANT
GLOVE BIO SURGEON STRL SZ7.5 (GLOVE) ×1 IMPLANT
GLOVE BIOGEL PI IND STRL 7.0 (GLOVE) IMPLANT
GLOVE BIOGEL PI IND STRL 7.5 (GLOVE) ×2 IMPLANT
GLOVE BIOGEL PI IND STRL 8 (GLOVE) ×1 IMPLANT
GLOVE BIOGEL PI IND STRL 8.5 (GLOVE) IMPLANT
GLOVE SURG SS PI 7.0 STRL IVOR (GLOVE) IMPLANT
GLOVE SURG SYN 7.5  E (GLOVE) ×1
GLOVE SURG SYN 7.5 E (GLOVE) ×1 IMPLANT
GLOVE SURG SYN 7.5 PF PI (GLOVE) ×1 IMPLANT
GOWN STRL REUS W/ TWL LRG LVL3 (GOWN DISPOSABLE) ×3 IMPLANT
GOWN STRL REUS W/ TWL XL LVL3 (GOWN DISPOSABLE) ×1 IMPLANT
GOWN STRL REUS W/TWL LRG LVL3 (GOWN DISPOSABLE) ×3
GOWN STRL REUS W/TWL XL LVL3 (GOWN DISPOSABLE) ×1
MANIFOLD NEPTUNE II (INSTRUMENTS) ×1 IMPLANT
PACK ARTHROSCOPY DSU (CUSTOM PROCEDURE TRAY) ×1 IMPLANT
PACK BASIN DAY SURGERY FS (CUSTOM PROCEDURE TRAY) ×1 IMPLANT
PACK ICE MAXI GEL EZY WRAP (MISCELLANEOUS) IMPLANT
PENCIL SMOKE EVACUATOR (MISCELLANEOUS) IMPLANT
PORT APPOLLO RF 90DEGREE MULTI (SURGICAL WAND) IMPLANT
SPONGE T-LAP 4X18 ~~LOC~~+RFID (SPONGE) IMPLANT
SUCTION FRAZIER HANDLE 10FR (MISCELLANEOUS) ×1
SUCTION TUBE FRAZIER 10FR DISP (MISCELLANEOUS) IMPLANT
SUT ETHILON 3 0 PS 1 (SUTURE) ×1 IMPLANT
TAPE CLOTH 3X10 TAN LF (GAUZE/BANDAGES/DRESSINGS) IMPLANT
TOWEL GREEN STERILE FF (TOWEL DISPOSABLE) ×1 IMPLANT
TUBING ARTHROSCOPY IRRIG 16FT (MISCELLANEOUS) ×1 IMPLANT
WATER STERILE IRR 1000ML POUR (IV SOLUTION) ×1 IMPLANT
WRAP KNEE MAXI GEL POST OP (GAUZE/BANDAGES/DRESSINGS) ×1 IMPLANT

## 2022-08-03 NOTE — Op Note (Signed)
08/03/2022  11:32 AM  PATIENT:  Anthony Garcia    PRE-OPERATIVE DIAGNOSIS:  LOOSE BODY LEFT KNEE  POST-OPERATIVE DIAGNOSIS:  Same  PROCEDURE:  ARTHROSCOPY KNEE AND ARTHROTOMY WITH LOOSE BODY REMOVAL  SURGEON:  Renette Butters, MD  ASSISTANT: Aggie Moats, PA-C, he was present and scrubbed throughout the case, critical for completion in a timely fashion, and for retraction, instrumentation, and closure.   ANESTHESIA:   General  BLOOD LOSS: min  COMPLICATIONS: None   PREOPERATIVE INDICATIONS:  Anthony Garcia is a  46 y.o. male with a diagnosis of [place] who failed conservative measures and elected for surgical management.    The risks benefits and alternatives were discussed with the patient preoperatively including but not limited to the risks of infection, bleeding, nerve injury, cardiopulmonary complications, the need for revision surgery, among others, and the patient was willing to proceed.  OPERATIVE IMPLANTS: None  OPERATIVE FINDINGS: Examination under anesthesia: Stable palpable mobile loose body medial gutter Diagnostic Arthroscopy:  articular cartilage: Grade 2 changes patellofemoral Medial meniscus: Stable Lateral meniscus: Stable Anterior cruciate ligament/PCL: Intact diminutive graft Loose bodies: 3 loose bodies 2 large wounds of 8 mm and 15 mm    OPERATIVE PROCEDURE:  Patient was identified in the preoperative holding area and site was marked by me male was transported to the operating theater and placed on the table in supine position taking care to pad all bony prominences. After a preincinduction time out anesthesia was induced.  male received Ancef for preoperative antibiotics. The left lower extremity was prepped and draped in normal sterile fashion and a pre-incision timeout was performed.   A small stab incision was made in the anterolateral portal position. The arthroscope was introduced in the joint. A medial portal was then established  under direct visualization just above the anterior horn of the medial meniscus. Diagnostic arthroscopy was then carried out with findings as described above.  I inserted the arthroscope and examined his knee and noted the above findings he had a mobile loose body that was in the medial gutter and able to deliver easily into the medial joint I remove this with a grabber  I then had to take my medial portal and make this a larger arthrotomy as the other piece was significantly larger over a centimeter I shelled this out from the scarred and soft tissue and then used a hemostat to remove this piece through the arthrotomy  The arthroscopic equipment was removed from the joint and the portals were closed with 3-0 nylon in an interrupted fashion. The knee was infiltrated with Marcaine 10 cc.  Sterile dressings were then applied including Xeroform 4 x 4's ABDs an ACE bandage.  The patient was then allowed to awaken from general anesthesia, transferred to the stretcher and taken to the recovery room in stable condition.  POSTOPERATIVE PLAN: The patient will be discharged home today and will followup in one week for suture removal and wound check.  VTE prophylaxis: Mobilize and chemical DVT prophylaxis

## 2022-08-03 NOTE — Interval H&P Note (Signed)
History and Physical Interval Note:  08/03/2022 10:22 AM  Anthony Garcia  has presented today for surgery, with the diagnosis of LOOSE BODY LEFT KNEE.  The various methods of treatment have been discussed with the patient and family. After consideration of risks, benefits and other options for treatment, the patient has consented to  Procedure(s): ARTHROSCOPY KNEE AND ARTHROTOMY WITH LOOSE BODY REMOVAL (Left) as a surgical intervention.  The patient's history has been reviewed, patient examined, no change in status, stable for surgery.  I have reviewed the patient's chart and labs.  Questions were answered to the patient's satisfaction.     Renette Butters

## 2022-08-03 NOTE — Transfer of Care (Signed)
Immediate Anesthesia Transfer of Care Note  Patient: Anthony Garcia  Procedure(s) Performed: ARTHROSCOPY KNEE AND ARTHROTOMY WITH LOOSE BODY REMOVAL (Left: Knee)  Patient Location: PACU  Anesthesia Type:General  Level of Consciousness: drowsy and patient cooperative  Airway & Oxygen Therapy: Patient Spontanous Breathing and Patient connected to face mask oxygen  Post-op Assessment: Report given to RN and Post -op Vital signs reviewed and stable  Post vital signs: Reviewed and stable  Last Vitals:  Vitals Value Taken Time  BP    Temp    Pulse 89 08/03/22 1127  Resp 16 08/03/22 1127  SpO2 94 % 08/03/22 1127  Vitals shown include unvalidated device data.  Last Pain:  Vitals:   08/03/22 1006  TempSrc: Oral  PainSc: 0-No pain         Complications: No notable events documented.

## 2022-08-03 NOTE — Discharge Instructions (Addendum)
POST-OPERATIVE OPIOID TAPER INSTRUCTIONS: It is important to wean off of your opioid medication as soon as possible. If you do not need pain medication after your surgery it is ok to stop day one. Opioids include: Codeine, Hydrocodone(Norco, Vicodin), Oxycodone(Percocet, oxycontin) and hydromorphone amongst others.  Long term and even short term use of opiods can cause: Increased pain response Dependence Constipation Depression Respiratory depression And more.  Withdrawal symptoms can include Flu like symptoms Nausea, vomiting And more Techniques to manage these symptoms Hydrate well Eat regular healthy meals Stay active Use relaxation techniques(deep breathing, meditating, yoga) Do Not substitute Alcohol to help with tapering If you have been on opioids for less than two weeks and do not have pain than it is ok to stop all together.  Plan to wean off of opioids This plan should start within one week post op of your joint replacement. Maintain the same interval or time between taking each dose and first decrease the dose.  Cut the total daily intake of opioids by one tablet each day Next start to increase the time between doses. The last dose that should be eliminated is the evening dose.     Post Anesthesia Home Care Instructions  Activity: Get plenty of rest for the remainder of the day. A responsible individual must stay with you for 24 hours following the procedure.  For the next 24 hours, DO NOT: -Drive a car -Paediatric nurse -Drink alcoholic beverages -Take any medication unless instructed by your physician -Make any legal decisions or sign important papers.  Meals: Start with liquid foods such as gelatin or soup. Progress to regular foods as tolerated. Avoid greasy, spicy, heavy foods. If nausea and/or vomiting occur, drink only clear liquids until the nausea and/or vomiting subsides. Call your physician if vomiting continues.  Special Instructions/Symptoms: Your  throat may feel dry or sore from the anesthesia or the breathing tube placed in your throat during surgery. If this causes discomfort, gargle with warm salt water. The discomfort should disappear within 24 hours.  If you had a scopolamine patch placed behind your ear for the management of post- operative nausea and/or vomiting:  1. The medication in the patch is effective for 72 hours, after which it should be removed.  Wrap patch in a tissue and discard in the trash. Wash hands thoroughly with soap and water. 2. You may remove the patch earlier than 72 hours if you experience unpleasant side effects which may include dry mouth, dizziness or visual disturbances. 3. Avoid touching the patch. Wash your hands with soap and water after contact with the patch.

## 2022-08-03 NOTE — Anesthesia Postprocedure Evaluation (Signed)
Anesthesia Post Note  Patient: Layth Schwegler  Procedure(s) Performed: ARTHROSCOPY KNEE AND ARTHROTOMY WITH LOOSE BODY REMOVAL (Left: Knee)     Patient location during evaluation: PACU Anesthesia Type: General Level of consciousness: awake and alert Pain management: pain level controlled Vital Signs Assessment: post-procedure vital signs reviewed and stable Respiratory status: spontaneous breathing, nonlabored ventilation, respiratory function stable and patient connected to nasal cannula oxygen Cardiovascular status: blood pressure returned to baseline and stable Postop Assessment: no apparent nausea or vomiting Anesthetic complications: no   No notable events documented.  Last Vitals:  Vitals:   08/03/22 1200 08/03/22 1211  BP: 119/89   Pulse: 91 94  Resp: 15 16  Temp:  (!) 36.2 C  SpO2: 94% 97%    Last Pain:  Vitals:   08/03/22 1211  TempSrc: Oral  PainSc: 0-No pain                 Belenda Cruise P Daisey Caloca

## 2022-08-03 NOTE — Anesthesia Procedure Notes (Signed)
Procedure Name: LMA Insertion Date/Time: 08/03/2022 10:40 AM  Performed by: Loleta Frommelt, Ernesta Amble, CRNAPre-anesthesia Checklist: Patient identified, Emergency Drugs available, Suction available and Patient being monitored Patient Re-evaluated:Patient Re-evaluated prior to induction Oxygen Delivery Method: Circle system utilized Preoxygenation: Pre-oxygenation with 100% oxygen Induction Type: IV induction Ventilation: Mask ventilation without difficulty LMA: LMA inserted LMA Size: 4.0 Number of attempts: 1 Airway Equipment and Method: Bite block Placement Confirmation: positive ETCO2 Tube secured with: Tape Dental Injury: Teeth and Oropharynx as per pre-operative assessment

## 2022-08-03 NOTE — Anesthesia Preprocedure Evaluation (Signed)
Anesthesia Evaluation  Patient identified by MRN, date of birth, ID band Patient awake    Reviewed: Allergy & Precautions, NPO status , Patient's Chart, lab work & pertinent test results  Airway Mallampati: II  TM Distance: >3 FB Neck ROM: Full    Dental no notable dental hx.    Pulmonary neg pulmonary ROS, former smoker,    Pulmonary exam normal        Cardiovascular hypertension, + dysrhythmias (patient noncompliant with BB or anticoagulation meds per cards) Atrial Fibrillation  Rhythm:Regular Rate:Normal  ECHO: 1. Left ventricular ejection fraction, by estimation, is 50 to 55%. The  left ventricle has low normal function. The left ventricle has no regional  wall motion abnormalities. Left ventricular diastolic parameters are  indeterminate.  2. Right ventricular systolic function is normal. The right ventricular  size is normal.  3. Left atrial size was mildly dilated.  4. The mitral valve is normal in structure. Mild mitral valve  regurgitation. No evidence of mitral stenosis.  5. The aortic valve is normal in structure. Aortic valve regurgitation is  mild. No aortic stenosis is present.  6. The inferior vena cava is normal in size with greater than 50%  respiratory variability, suggesting right atrial pressure of 3 mmHg.   Neuro/Psych Anxiety negative neurological ROS     GI/Hepatic negative GI ROS, Neg liver ROS,   Endo/Other  negative endocrine ROS  Renal/GU negative Renal ROS  negative genitourinary   Musculoskeletal  (+) Arthritis , Osteoarthritis,    Abdominal Normal abdominal exam  (+)   Peds  Hematology negative hematology ROS (+)   Anesthesia Other Findings   Reproductive/Obstetrics                             Anesthesia Physical Anesthesia Plan  ASA: 3  Anesthesia Plan: General   Post-op Pain Management:    Induction: Intravenous  PONV Risk Score and Plan:  2 and Ondansetron, Dexamethasone, Midazolam and Treatment may vary due to age or medical condition  Airway Management Planned: LMA and Mask  Additional Equipment: None  Intra-op Plan:   Post-operative Plan: Extubation in OR  Informed Consent: I have reviewed the patients History and Physical, chart, labs and discussed the procedure including the risks, benefits and alternatives for the proposed anesthesia with the patient or authorized representative who has indicated his/her understanding and acceptance.     Dental advisory given  Plan Discussed with:   Anesthesia Plan Comments: (Lab Results      Component                Value               Date                      WBC                      5.1                 06/18/2021                HGB                      17.5                06/18/2021  HCT                      54.0 (H)            06/18/2021                MCV                      87                  06/18/2021                PLT                      238                 06/18/2021           Lab Results      Component                Value               Date                      NA                       138                 06/18/2021                K                        4.6                 06/18/2021                CO2                      22                  06/18/2021                GLUCOSE                  97                  06/18/2021                BUN                      29 (H)              06/18/2021                CREATININE               1.08                06/18/2021                CALCIUM                  10.1                06/18/2021                EGFR  87                  06/18/2021                GFRNONAA                 >60                 04/05/2021          )        Anesthesia Quick Evaluation

## 2022-08-04 ENCOUNTER — Encounter (HOSPITAL_BASED_OUTPATIENT_CLINIC_OR_DEPARTMENT_OTHER): Payer: Self-pay | Admitting: Orthopedic Surgery

## 2022-08-17 ENCOUNTER — Ambulatory Visit (HOSPITAL_BASED_OUTPATIENT_CLINIC_OR_DEPARTMENT_OTHER)
Admission: RE | Admit: 2022-08-17 | Discharge: 2022-08-17 | Disposition: A | Discharge status: Home / Self Care | Payer: No Typology Code available for payment source | Source: Ambulatory Visit | Attending: General Practice | Admitting: General Practice

## 2022-08-17 DIAGNOSIS — I2510 Atherosclerotic heart disease of native coronary artery without angina pectoris: Secondary | ICD-10-CM

## 2022-08-17 DIAGNOSIS — E7849 Other hyperlipidemia: Secondary | ICD-10-CM

## 2022-08-23 ENCOUNTER — Ambulatory Visit
Payer: No Typology Code available for payment source | Attending: Cardiovascular Disease | Admitting: Cardiovascular Disease

## 2022-08-23 ENCOUNTER — Encounter: Payer: Self-pay | Admitting: Cardiovascular Disease

## 2022-08-23 VITALS — BP 118/74 | HR 119 | Ht 74.0 in | Wt 209.0 lb

## 2022-08-23 DIAGNOSIS — I4811 Longstanding persistent atrial fibrillation: Secondary | ICD-10-CM | POA: Diagnosis not present

## 2022-08-23 NOTE — Progress Notes (Signed)
Electrophysiology Office Note:    Date:  08/23/2022   ID:  Anthony Garcia, DOB 01/25/1976, MRN [id number]  PCP:  Kathyrn Lass   [facility] Cardiologist:  Shelva Majestic, MD Electrophysiologist:  Melida Quitter, MD     Referring MD: Deberah Pelton, NP   Chief complaint: talk about options for AF management  History of Present Illness:    Anthony Garcia is a 46 y.o. male with a hx of HTN, obesity, atrial fibrillation.  The first EKG with atrial fibrillation appears to be from 2020.  Every ECG I have seen in the system since that time shows atrial fibrillation.  He notes that he was previously more symptomatic with A-fib and he has at present --sounds like palpitations were worse.  He mentioned that his heart rates improved significantly after he lost a lot of weight, 60 lbs. previously, he had hypertension, but this resolved with weight loss as well.  He does note palpitations whenever he lies on his left side.  Sometimes he senses his A-fib during normal activities, sometimes not.  He has been prescribed metoprolol in the past for rate control, but he did not appreciate a significant improvement while taking it.  He has also tried breathing exercises and meditation to help control his heart rates and has noted some success with this.  He does seem averse to taking medications. He has declined anticoagulation and quit taking betablockers. He is taking Nattokinase for anticoagulation.  He has not had syncope, presyncope, chest pain. He has left knee swelling after recent arthroscopy but is otherwise without edema.    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 [facility];   per note pt was prescribed BB and eliquis however pt has chose not to take them , stated he take supplement nattokinase (" natural blood thinner") ;  cardiac CT 05-24-2021 calcium score= 269 coronary calcification ;  echo 07-07-2021 ef 50-55%, mild LAE, mild MR/ AR no stenosis    Past Surgical History:  Procedure Laterality Date   ANTERIOR CRUCIATE LIGAMENT REPAIR Left    x3   last one approx 2010   KNEE ARTHROSCOPY Left    approx 2008   KNEE ARTHROSCOPY Left 08/03/2022   Procedure: ARTHROSCOPY KNEE AND ARTHROTOMY WITH LOOSE BODY REMOVAL;  Surgeon: Renette Butters, MD;  Location: [facility];  Service: Orthopedics;  Laterality: Left;    Current Medications: Current Meds  Medication Sig   Berberine Chloride (BERBERINE HCI PO) Take 1 capsule by mouth daily at 12 noon.   Cholecalciferol (VITAMIN D3 PO) Take 1 capsule by mouth daily.   NATTOKINASE PO Take 1 capsule by mouth daily.     Allergies:   Patient has no known allergies.   Social History   Socioeconomic History   Marital status: Single    Spouse name: [name]  Number of children: 3   Years of education: Not on file   Highest education level: 12th grade  Occupational History   Occupation: [employer] [place]  Tobacco Use   Smoking status: Former    Packs/day: 0.50    Years: 20.00    Total pack years: 10.00    Types: Cigarettes    Quit date: 2018    Years since quitting: 5.8   Smokeless tobacco: Never  Vaping Use   Vaping Use: Former   Devices: quit approx 2020  Substance and Sexual Activity   Alcohol use: No   Drug use: Not Currently    Types: Marijuana    Comment: 07-28-2022  pt stated none for several yrs   Sexual activity: Yes  Other Topics Concern   Not on file  Social History  Narrative   Right handed    Caffeine 1 - 2 cups daily     Lives at home with wife/children    Social Determinants of Health   Financial Resource Strain: Not on file  Food Insecurity: Not on file  Transportation Needs: Not on file  Physical Activity: Not on file  Stress: Not on file  Social Connections: Not on file     Family History: The patient's family history includes Anemia in his mother; Atrial fibrillation in his brother; Healthy in his son, son, and son; Heart attack in his father; Hyperlipidemia in his brother and father; Hypertension in his brother and father. There is no history of Allergic rhinitis, Angioedema, Asthma, Eczema, Immunodeficiency, or Urticaria.  ROS:   Please see the history of present illness.    All other systems reviewed and are negative.  EKGs/Labs/Other Studies Reviewed Today:     TTE 06/2021 EF 50-55%, mild LA dilation  EKG:  Last EKG results: today - AF with RVR and rate-related RBBB   Recent Labs: No results found for requested labs within last 365 days.     Physical Exam:    VS:  BP 118/74   Pulse (!) 119   Ht '6\' 2"'$  (1.88 m)   Wt 209 lb (94.8 kg)   SpO2 96%   BMI 26.83 kg/m     Wt Readings from Last 3 Encounters:  08/23/22 209 lb (94.8 kg)  08/03/22 202 lb 9.6 oz (91.9 kg)  07/13/22 204 lb 6.4 oz (92.7 kg)     GEN:  Well nourished, well developed in no acute distress CARDIAC: RRR, no murmurs, rubs, gallops RESPIRATORY:  Normal work of breathing MUSCULOSKELETAL: no edema    ASSESSMENT & PLAN:    Longstanding persistent AF: appears to be persistent for at least 3 years now. He is symptomatic with palpitations. Hard to gauge his level of fatigue without having experienced sinus rhythm for years. He would like to know management options. I explained that the likelihood of durable sinus rhythm is reduced since he has been in AF so long, but it is a good prognostication that his atrium was mildly dilated and his EF is very coarse  on ECG. I think a DC cardioversion is very reasonable to assess his propensity to maintain sinus and potentially see if there is improvement in normal rhythm. He would like to think about it. I also described the ablation procedure to him, the logistics, risks, benefits, and likelihood of success, which is compromised by the fact he has been in AF for so long.      Medication Adjustments/Labs and Tests Ordered: Current medicines are reviewed at length with the  patient today.  Concerns regarding medicines are outlined above.  No orders of the defined types were placed in this encounter.  No orders of the defined types were placed in this encounter.    Signed, Melida Quitter, MD  08/23/2022 4:30 PM    [place]

## 2022-08-23 NOTE — Patient Instructions (Signed)
Medication Instructions:  Your physician recommends that you continue on your current medications as directed. Please refer to the Current Medication list given to you today.  *If you need a refill on your cardiac medications before your next appointment, please call your pharmacy*  Follow-Up: At [facility], you and your health needs are our priority.  As part of our continuing mission to provide you with exceptional heart care, we have created designated Provider Care Teams.  These Care Teams include your primary Cardiologist (physician) and Advanced Practice Providers (APPs -  Physician Assistants and Nurse Practitioners) who all work together to provide you with the care you need, when you need it.  Your next appointment:   Follow up as needed - call us if you decide to proceed with a cardioversion or an ablation  Important Information About Sugar

## 2022-12-17 ENCOUNTER — Telehealth: Payer: Self-pay | Admitting: Cardiovascular Disease

## 2022-12-17 NOTE — Telephone Encounter (Signed)
Called patient back to discuss.  Left call back number.

## 2022-12-17 NOTE — Telephone Encounter (Signed)
Pt called back. He asked if it can just be refilled. I informed him it was taken off his med list in September because he reported he wasn't taking it. He states he was told to only take it when needed and he's been needing it lately. He states he's been taking metoprolol succinate '50mg'$  daily for a month now.

## 2022-12-17 NOTE — Telephone Encounter (Signed)
Pt returning call

## 2022-12-17 NOTE — Telephone Encounter (Signed)
Left message to call the clinic.

## 2022-12-17 NOTE — Telephone Encounter (Signed)
Pt c/o medication issue:  1. Name of Medication: metoprolol succinate (TOPROL-XL) 50 MG 24 hr tablet   metoprolol tartrate (LOPRESSOR) 50 MG tablet   2. How are you currently taking this medication (dosage and times per day)?    3. Are you having a reaction (difficulty breathing--STAT)? no  4. What is your medication issue? Patient wants to start back taking these medication. Please advise

## 2022-12-20 MED ORDER — METOPROLOL SUCCINATE ER 50 MG PO TB24: 50.0000 mg | ORAL_TABLET | Freq: Every day | ORAL | 3 refills | Status: AC

## 2022-12-20 MED ORDER — METOPROLOL TARTRATE 50 MG PO TABS: 50.0000 mg | ORAL_TABLET | Freq: Every day | ORAL | 3 refills | Status: AC | PRN

## 2022-12-20 NOTE — Telephone Encounter (Signed)
Spoke with pt regarding is range of heart rates. Pt has kardia mobile and states that range at the highest can be 150 to 170bpm. Pt states when he was taking metoprolol succinate '50mg'$  consistently his heart rate was down to 100-105bpm resting. Pt would also like to have metoprolol tartrate on hand for times when his heart rate spikes. Will send to Dr. Myles Gip to advise. Pt verbalizes understanding.  Pt states that he does not require a call back, he is good if medication is sent in.

## 2022-12-20 NOTE — Telephone Encounter (Signed)
Patient is returning call.  °

## 2022-12-20 NOTE — Addendum Note (Signed)
Addended by: Molli Barrows on: 12/20/2022 05:14 PM   Modules accepted: Orders

## 2022-12-20 NOTE — Telephone Encounter (Signed)
Will route to Dr Myles Gip to see if refills are appropriate since neither are current active medications for him

## 2022-12-20 NOTE — Telephone Encounter (Signed)
Left message for patient to call back  

## 2022-12-20 NOTE — Telephone Encounter (Signed)
Per Dr. Myles Gip:  Im ok with metoprolol tartrate PRN  And metoprolol succinate '50mg'$  QD.  Rx for metoprolol succinate (Toprol XL) '50mg'$  daily and metoprolol tartrate (Lopressor) '50mg'$  once daily as needed for palpitations sent to pharmacy.

## 2023-01-24 ENCOUNTER — Other Ambulatory Visit: Payer: Self-pay | Admitting: Cardiovascular Disease

## 2023-06-09 ENCOUNTER — Encounter: Payer: Self-pay | Admitting: Emergency Medicine

## 2023-06-09 ENCOUNTER — Other Ambulatory Visit: Payer: Self-pay

## 2023-06-09 ENCOUNTER — Ambulatory Visit
Admission: EM | Admit: 2023-06-09 | Discharge: 2023-06-09 | Disposition: A | Discharge status: Home / Self Care | Payer: No Typology Code available for payment source | Attending: Physician Assistant | Admitting: Physician Assistant

## 2023-06-09 DIAGNOSIS — L309 Dermatitis, unspecified: Secondary | ICD-10-CM | POA: Diagnosis not present

## 2023-06-09 MED ORDER — TRIAMCINOLONE ACETONIDE 0.1 % EX CREA: 1.0000 | TOPICAL_CREAM | Freq: Two times a day (BID) | CUTANEOUS | 0 refills | Status: AC

## 2023-06-09 MED ORDER — DEXAMETHASONE SODIUM PHOSPHATE 10 MG/ML IJ SOLN
10.0000 mg | Freq: Once | INTRAMUSCULAR | Status: AC
  Administered 2023-06-09: 10 mg via INTRAMUSCULAR

## 2023-06-09 NOTE — ED Triage Notes (Signed)
Rash noticed one week ago.  Reports rash itches.  Denies any other symptoms  areas all over body:  reports left hip and thigh with rash.  Rash to both sides of neck, left hand.    Calamine, Cortizone cream, poison ivy products

## 2023-06-09 NOTE — ED Provider Notes (Signed)
[facility]    CSN: [id number] Arrival date & time: 06/09/23  1109      History   Chief Complaint Chief Complaint  Patient presents with   Rash    HPI Anthony Garcia is a 47 y.o. male.   Patient here today for evaluation of rash that is present to his hands/arms/hips and legs.  He also has a rash to both sides of his neck.  He notes rash is itchy.  He has tried using calamine lotion, cortisone cream and poison ivy products without resolution.  He has not had any fever.  He denies any shortness of breath or trouble swallowing.  The history is provided by the patient.  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 [facility] End[facility];   per note pt was prescribed BB and eliquis however pt has chose not to take them , stated he take supplement nattokinase (" natural blood thinner") ;  cardiac CT 05-24-2021 calcium score= 269 coronary calcification ;  echo 07-07-2021 ef 50-55%, mild LAE, mild MR/ AR no stenosis    Patient Active Problem List   Diagnosis Date Noted   Rash 01/17/2018   Hearing loss in left ear 01/17/2018   Multiple benign nevi without atypia- specifically 4*74mm R upper outer thigh 02/28/2017   History of episode of anxiety 12/16/2016   Hyperlipidemia 11/08/2016   Hypertension 11/08/2016   H/O knee surgery- multiple L knee sx 11/08/2016   Snores 11/08/2016   Breath holding episodes 11/08/2016    Acute reaction to stress/ Anxiety 11/08/2016   medical Noncompliance 11/08/2016   Tobacco use disorder 11/08/2016   Tobacco abuse counseling 11/08/2016   Palpitations 11/08/2016   Obesity, Class I, BMI 30-34.9 11/08/2016   Family history of early CAD 11/08/2016   Perennial allergic rhinitis 11/08/2016    Past Surgical History:  Procedure Laterality Date   ANTERIOR CRUCIATE LIGAMENT REPAIR Left    x3   last one approx 2010   KNEE ARTHROSCOPY Left    approx 2008   KNEE ARTHROSCOPY Left 08/03/2022   Procedure: ARTHROSCOPY KNEE AND ARTHROTOMY WITH LOOSE BODY REMOVAL;  Surgeon: Sheral Apley, MD;  Location: [facility];  Service: Orthopedics;  Laterality: Left;       Home Medications    Prior to Admission medications   Medication Sig Start Date End Date Taking? Authorizing Provider  triamcinolone cream (KENALOG) 0.1 % Apply 1 Application topically 2 (two) times daily. 06/09/23  Yes Tomi Bamberger, PA-C  Berberine Chloride (BERBERINE HCI PO) Take 1 capsule by mouth daily at 12 noon.    [provider]  Cholecalciferol (VITAMIN D3 PO) Take 1 capsule by mouth daily. Patient not taking: Reported on 06/09/2023    [provider]  metoprolol succinate (TOPROL XL)  50 MG 24 hr tablet Take 1 tablet (50 mg total) by mouth daily. Take with or immediately following a meal. 12/20/22   Mealor, Roberts Gaudy, MD  metoprolol tartrate (LOPRESSOR) 50 MG tablet Take 1 tablet (50 mg total) by mouth daily as needed (palpitations). 12/20/22   Mealor, Roberts Gaudy, MD  NATTOKINASE PO Take 1 capsule by mouth daily.    [provider]    Family History Family History  Problem Relation Age of Onset   Anemia Mother    Heart attack Father    Hyperlipidemia Father    Hypertension Father    Hyperlipidemia Brother    Hypertension Brother    Atrial fibrillation Brother    Healthy Son    Healthy Son    Healthy Son    Allergic rhinitis Neg Hx    Angioedema Neg Hx     Asthma Neg Hx    Eczema Neg Hx    Immunodeficiency Neg Hx    Urticaria Neg Hx     Social History Social History   Tobacco Use   Smoking status: Former    Current packs/day: 0.00    Average packs/day: 0.5 packs/day for 20.0 years (10.0 ttl pk-yrs)    Types: Cigarettes    Start date: 65    Quit date: 2018    Years since quitting: 6.6   Smokeless tobacco: Never  Vaping Use   Vaping status: Former   Devices: quit approx 2020  Substance Use Topics   Alcohol use: No   Drug use: Yes    Types: Marijuana    Comment: 07-28-2022  pt stated none for several yrs     Allergies   Patient has no known allergies.   Review of Systems Review of Systems  Constitutional:  Negative for chills and fever.  HENT:  Negative for trouble swallowing.   Eyes:  Negative for discharge and redness.  Respiratory:  Negative for shortness of breath.   Skin:  Positive for rash. Negative for color change.  Neurological:  Negative for numbness.     Physical Exam Triage Vital Signs ED Triage Vitals  Encounter Vitals Group     BP 06/09/23 1119 133/89     Systolic BP Percentile --      Diastolic BP Percentile --      Pulse Rate 06/09/23 1119 89     Resp 06/09/23 1119 18     Temp 06/09/23 1119 (!) 97.5 F (36.4 C)     Temp Source 06/09/23 1119 Oral     SpO2 06/09/23 1119 97 %     Weight --      Height --      Head Circumference --      Peak Flow --      Pain Score 06/09/23 1117 0     Pain Loc --      Pain Education --      Exclude from Growth Chart --    No data found.  Updated Vital Signs BP 133/89 (BP Location: Left Arm) Comment (BP Location): large cuff  Pulse 89   Temp (!) 97.5 F (36.4 C) (Oral)   Resp 18   SpO2 97%     Physical Exam Vitals and nursing note reviewed.  Constitutional:      General: He is not in acute distress.    Appearance: Normal appearance. He is not ill-appearing.  HENT:     Head: Normocephalic and atraumatic.  Eyes:     Conjunctiva/sclera:  Conjunctivae normal.  Cardiovascular:     Rate and Rhythm: Normal rate.  Pulmonary:     Effort: Pulmonary effort is normal. No respiratory distress.  Skin:    Comments: Mildly erythematous vesicular lesions in clusters to bilateral hands, forearms, neck  Neurological:     Mental Status: He is alert.  Psychiatric:        Mood and Affect: Mood normal.        Behavior: Behavior normal.        Thought Content: Thought content normal.      UC Treatments / Results  Labs (all labs ordered are listed, but only abnormal results are displayed) Labs Reviewed - No data to display  EKG   Radiology No results found.  Procedures Procedures (including critical care time)  Medications Ordered in UC Medications  dexamethasone (DECADRON) injection 10 mg (10 mg Intramuscular Given 06/09/23 1307)    Initial Impression / Assessment and Plan / UC Course  I have reviewed the triage vital signs and the nursing notes.  Pertinent labs & imaging results that were available during my care of the patient were reviewed by me and considered in my medical decision making (see chart for details).    Rash consistent with poison ivy.  Will treat with steroid injection in office and topical steroid cream at home and recommended follow-up if no gradual improvement or with any further concerns.  Final Clinical Impressions(s) / UC Diagnoses   Final diagnoses:  Dermatitis   Discharge Instructions   None    ED Prescriptions     Medication Sig Dispense Auth. Provider   triamcinolone cream (KENALOG) 0.1 % Apply 1 Application topically 2 (two) times daily. 30 g Tomi Bamberger, PA-C      PDMP not reviewed this encounter.   Tomi Bamberger, PA-C 06/09/23 1610
